# Patient Record
Sex: Male | Born: 1980 | Race: White | Hispanic: No | Marital: Married | Smoking: Current every day smoker
Health system: Southern US, Community
[De-identification: ages and names within clinical notes are randomized; demographics above are authoritative.]

## PROBLEM LIST (undated history)

## (undated) DIAGNOSIS — F32A Depression, unspecified: Secondary | ICD-10-CM

## (undated) DIAGNOSIS — F329 Major depressive disorder, single episode, unspecified: Secondary | ICD-10-CM

## (undated) DIAGNOSIS — K219 Gastro-esophageal reflux disease without esophagitis: Secondary | ICD-10-CM

## (undated) DIAGNOSIS — F419 Anxiety disorder, unspecified: Secondary | ICD-10-CM

## (undated) HISTORY — DX: Gastro-esophageal reflux disease without esophagitis: K21.9

## (undated) HISTORY — DX: Major depressive disorder, single episode, unspecified: F32.9

## (undated) HISTORY — DX: Depression, unspecified: F32.A

---

## 2010-01-08 ENCOUNTER — Ambulatory Visit: Payer: Self-pay | Admitting: Family Medicine

## 2010-01-08 DIAGNOSIS — F17200 Nicotine dependence, unspecified, uncomplicated: Secondary | ICD-10-CM | POA: Insufficient documentation

## 2010-08-15 ENCOUNTER — Emergency Department (HOSPITAL_BASED_OUTPATIENT_CLINIC_OR_DEPARTMENT_OTHER): Admission: EM | Admit: 2010-08-15 | Discharge: 2010-08-15 | Payer: Self-pay | Admitting: Emergency Medicine

## 2010-12-13 NOTE — Assessment & Plan Note (Signed)
Summary: NEW TO [facility] PER DR FRY//CCM   Vital Signs:  Patient profile:   30 year old male Height:      72 inches Weight:      223 pounds BMI:     30.35 Temp:     98.2 degrees F oral Pulse rate:   77 / minute BP sitting:   94 / 72  (left arm) Cuff size:   large  Vitals Entered By: Alfred Levins, CMA (January 08, 2010 8:38 AM) CC: establish, quit smoking   History of Present Illness: 30 yr old male to establish with [place]. He is with his wife who is also a pt. of ours. He would like assistance to quit smoking. he smokes 12-15 cigarettes per day. he has tried to quit twice before using Commit lozenges, but he was unsuccessful. he feels fine and has no other concerns. They just had a baby 3 weeks ago, and he is doing well.   Preventive Screening-Counseling & Management  Alcohol-Tobacco     Smoking Status: current     Packs/Day: 12 Cigs      Year Started: 1998     Pack years: 24  Caffeine-Diet-Exercise     Does Patient Exercise: no      Drug Use:  no.    Current Medications (verified): 1)  None  Allergies (verified): No Known Drug Allergies  Past History:  Past Medical History: Unremarkable  Past Surgical History: Denies surgical history  Family History: Reviewed history and no changes required. Family History of Arthritis Family History of CAD Male 1st degree relative <50 Family History Diabetes 1st degree relative Family History Hypertension  Social History: Reviewed history and no changes required. Married Current Smoker Alcohol use-no Drug use-no Regular exercise-no Smoking Status:  current Drug Use:  no Does Patient Exercise:  no Packs/Day:  12 Cigs   Review of Systems  The patient denies anorexia, fever, weight loss, weight gain, vision loss, decreased hearing, hoarseness, chest pain, syncope, dyspnea on exertion, peripheral edema, prolonged cough, headaches, hemoptysis, abdominal pain, melena, hematochezia, severe indigestion/heartburn,  hematuria, incontinence, genital sores, muscle weakness, suspicious skin lesions, transient blindness, difficulty walking, depression, unusual weight change, abnormal bleeding, enlarged lymph nodes, angioedema, breast masses, and testicular masses.    Physical Exam  General:  Well-developed,well-nourished,in no acute distress; alert,appropriate and cooperative throughout examination Neck:  No deformities, masses, or tenderness noted. Lungs:  Normal respiratory effort, chest expands symmetrically. Lungs are clear to auscultation, no crackles or wheezes. Heart:  Normal rate and regular rhythm. S1 and S2 normal without gallop, murmur, click, rub or other extra sounds.   Impression & Recommendations:  Problem # 1:  NICOTINE ADDICTION (ICD-305.1)  His updated medication list for this problem includes:    Chantix Starting Month Pak 0.5 Mg X 11 & 1 Mg X 42 Tabs (Varenicline tartrate) .Marland Kitchen... As directed    Chantix Continuing Month Pak 1 Mg Tabs (Varenicline tartrate) .Marland Kitchen... As directed  Complete Medication List: 1)  Chantix Starting Month Pak 0.5 Mg X 11 & 1 Mg X 42 Tabs (Varenicline tartrate) .... As directed 2)  Chantix Continuing Month Pak 1 Mg Tabs (Varenicline tartrate) .... As directed  Patient Instructions: 1)  Try Chantix for 3  months. We also discussed drinking a lot of water, getting exercise, and other helpful tips.  2)  Please schedule a follow-up appointment as needed .  Prescriptions: CHANTIX CONTINUING MONTH PAK 1 MG TABS (VARENICLINE TARTRATE) as directed  #1 x 1   Entered and  Authorized by:   Nelwyn Salisbury MD   Signed by:   Nelwyn Salisbury MD on 01/08/2010   Method used:   Electronically to        [facility] [address] [phone]* (retail)       [address] [place], [place]  [address]       Ph: [phone]       Fax: [phone]   RxID:   [id number] CHANTIX STARTING MONTH PAK 0.5 MG X 11 & 1 MG X 42 TABS (VARENICLINE TARTRATE) as directed  #1 x 0   Entered and Authorized  by:   Nelwyn Salisbury MD   Signed by:   Nelwyn Salisbury MD on 01/08/2010   Method used:   Electronically to        [facility] [address] [phone]* (retail)       [address] [place], [place]  [address]       Ph: [phone]       Fax: [phone]   RxID:   [id number]

## 2013-07-24 ENCOUNTER — Emergency Department (HOSPITAL_COMMUNITY): Payer: BC Managed Care – PPO

## 2013-07-24 ENCOUNTER — Emergency Department (HOSPITAL_COMMUNITY)
Admission: EM | Admit: 2013-07-24 | Discharge: 2013-07-24 | Disposition: A | Discharge status: Home / Self Care | Payer: BC Managed Care – PPO | Attending: Emergency Medicine | Admitting: Emergency Medicine

## 2013-07-24 ENCOUNTER — Encounter (HOSPITAL_COMMUNITY): Payer: Self-pay | Admitting: *Deleted

## 2013-07-24 DIAGNOSIS — R0789 Other chest pain: Secondary | ICD-10-CM | POA: Insufficient documentation

## 2013-07-24 DIAGNOSIS — F411 Generalized anxiety disorder: Secondary | ICD-10-CM | POA: Insufficient documentation

## 2013-07-24 DIAGNOSIS — F17200 Nicotine dependence, unspecified, uncomplicated: Secondary | ICD-10-CM | POA: Insufficient documentation

## 2013-07-24 DIAGNOSIS — R0602 Shortness of breath: Secondary | ICD-10-CM | POA: Insufficient documentation

## 2013-07-24 DIAGNOSIS — R42 Dizziness and giddiness: Secondary | ICD-10-CM | POA: Insufficient documentation

## 2013-07-24 DIAGNOSIS — F419 Anxiety disorder, unspecified: Secondary | ICD-10-CM

## 2013-07-24 DIAGNOSIS — R079 Chest pain, unspecified: Secondary | ICD-10-CM

## 2013-07-24 LAB — CBC
HCT: 43.2 % (ref 39.0–52.0)
Hemoglobin: 15.4 g/dL (ref 13.0–17.0)
MCH: 30.4 pg (ref 26.0–34.0)
MCV: 85.2 fL (ref 78.0–100.0)
RBC: 5.07 MIL/uL (ref 4.22–5.81)

## 2013-07-24 LAB — POCT I-STAT, CHEM 8
BUN: 12 mg/dL (ref 6–23)
Creatinine, Ser: 1.1 mg/dL (ref 0.50–1.35)
Glucose, Bld: 101 mg/dL — ABNORMAL HIGH (ref 70–99)
Sodium: 142 mEq/L (ref 135–145)
TCO2: 23 mmol/L (ref 0–100)

## 2013-07-24 LAB — POCT I-STAT TROPONIN I

## 2013-07-24 NOTE — ED Provider Notes (Signed)
I saw and evaluated the patient, reviewed the resident's note and I agree with the findings and plan.  32 year old male history of ephedrine use presenting with atypical chest pain. EKG shows some nonspecific ST changes most consistent with early repolarization. He is symptom-free and ED. On exam, normal heart sounds, regular rate and rhythm, no murmurs rubs gallops. Lungs clear patient bilaterally. Generally well-appearing, not diaphoretic. He is low risk for ACS, but given his ephedrine use will check cardiac enzymes. His story is inconsistent with dissection or PE.  Clinical Impression: 1. Chest pain   2. Anxiety       Candyce Churn, MD 07/25/13 1020

## 2013-07-24 NOTE — ED Notes (Addendum)
Per EMS, pt was at the [facility] when he started having right chest pain and SOB.  Prior to EMS arrival pt CP had resolved, but some slight SOB.  Pt given 324 ASA by EMS, and is currently only complaining of slight dizziness

## 2013-07-24 NOTE — ED Provider Notes (Signed)
CSN: [id number]     Arrival date & time 07/24/13  1949 History   First MD Initiated Contact with Patient 07/24/13 1952     Chief Complaint  Patient presents with  . Chest Pain  . Shortness of Breath  . Dizziness   (Consider location/radiation/quality/duration/timing/severity/associated sxs/prior Treatment) Patient is a 32 y.o. male presenting with chest pain. The history is provided by the patient. No language interpreter was used.  Chest Pain Pain location:  R chest Pain quality comment:  Cramping Pain radiates to:  Does not radiate Pain radiates to the back: no   Pain severity:  Mild Onset quality:  Sudden Duration:  1 minute Timing:  Intermittent Progression:  Partially resolved Chronicity:  New Context: at rest   Relieved by:  Nothing Worsened by:  Nothing tried Ineffective treatments:  None tried Associated symptoms: shortness of breath   Associated symptoms: no abdominal pain, no cough, no fever, no headache, no nausea, no orthopnea and not vomiting   Risk factors: smoking   Risk factors comment:  Ephedrine use   History reviewed. No pertinent past medical history. History reviewed. No pertinent past surgical history. No family history on file. History  Substance Use Topics  . Smoking status: Current Every Day Smoker  . Smokeless tobacco: Not on file  . Alcohol Use: Not on file    Review of Systems  Constitutional: Negative for fever.  HENT: Negative for congestion, sore throat and rhinorrhea.   Respiratory: Positive for shortness of breath. Negative for cough.   Cardiovascular: Positive for chest pain. Negative for orthopnea.  Gastrointestinal: Negative for nausea, vomiting, abdominal pain and diarrhea.  Genitourinary: Negative for dysuria and hematuria.  Skin: Negative for rash.  Neurological: Positive for light-headedness. Negative for syncope and headaches.  All other systems reviewed and are negative.    Allergies  Review of patient's allergies  indicates no known allergies.  Home Medications  No current outpatient prescriptions on file. BP 114/66  Pulse 63  Temp(Src) 98.3 F (36.8 C) (Oral)  Resp 14  Ht 6\' 1"  (1.854 m)  Wt 228 lb (103.42 kg)  BMI 30.09 kg/m2  SpO2 97% Physical Exam  Nursing note and vitals reviewed. Constitutional: He is oriented to person, place, and time. He appears well-developed and well-nourished.  HENT:  Head: Normocephalic and atraumatic.  Right Ear: External ear normal.  Left Ear: External ear normal.  Eyes: EOM are normal.  Neck: Normal range of motion. Neck supple.  Cardiovascular: Normal rate, regular rhythm and intact distal pulses.  Exam reveals no gallop and no friction rub.   No murmur heard. Pulmonary/Chest: Effort normal and breath sounds normal. No respiratory distress. He has no wheezes. He has no rales. He exhibits no tenderness.  Abdominal: Soft. Bowel sounds are normal. He exhibits no distension. There is no tenderness. There is no rebound.  Musculoskeletal: Normal range of motion. He exhibits no edema and no tenderness.  Lymphadenopathy:    He has no cervical adenopathy.  Neurological: He is alert and oriented to person, place, and time.  Skin: Skin is warm. No rash noted.  Psychiatric: He has a normal mood and affect. His behavior is normal.    ED Course  Procedures (including critical care time) Labs Review Labs Reviewed  POCT I-STAT, CHEM 8 - Abnormal; Notable for the following:    Glucose, Bld 101 (*)    All other components within normal limits  CBC  POCT I-STAT TROPONIN I  POCT I-STAT TROPONIN I   Imaging  Review Dg Chest Portable 1 View  07/24/2013   CLINICAL DATA:  Chest pain, shortness of breath.  EXAM: PORTABLE CHEST - 1 VIEW  COMPARISON:  None.  FINDINGS: The heart size and mediastinal contours are within normal limits. Both lungs are clear. The visualized skeletal structures are unremarkable.  IMPRESSION: No active disease.   Electronically Signed   By: Charlett Nose M.D.   On: 07/24/2013 20:36    MDM   1. Chest pain   2. Anxiety    8:07 PM Pt is a 32 y.o. male who presents to the ED with chest pain. Chest pain for since 6 PM. Described as crampy right side of chestwithout radiation. Associated shortness of breath,no diaphoresis, no nausea and no vomiting. Denies pleuritic chest pain. Denies fevers, recent illness, cough, congestion, URI. Denies personal history/family history of DVT/PE, denies recent immobilization, denies OCP use. Symptoms without modifying factors. Pt received ASA at EMS. PERC negative. Pt had associated lightheadedness. Pt on ephedrine as an energy supplement.  Risk Factors:tobacco abuse, ephedrine use  On exam: AFVSS, lungs CTA, no rubs murmurs or gallops. Plan for CBC, CMP, troponin, CXR and EKG.  EKG personally reviewed by myself showed NSR Rate of 93, PR , QRS QT/QTC 345/461ms, normal axis, without evidence of new ischemia. No Comparison, indication: chest pain. No clinical evidence of acute pericarditis  CXR PA/LAT per my read for chest pain showed no ptx, no pna, no cardiomegaly  Review of labs: CBC showed no leukocytosis, no anemia. istat chem 8 showed no electrolyte abnormality. First istat troponin negative. Will get a delta troponin given smoking and ephedrine use  On re-eval pt has no pain. Second troponin at 11PM showed no elevation. Plan for discharge with follow up with PCP as needed.  11:45 PM: I have discussed the diagnosis/risks/treatment options with the patient and family and believe the pt to be eligible for discharge home to follow-up with PCP as needed. We also discussed returning to the ED immediately if new or worsening sx occur. We discussed the sx which are most concerning (e.g., worsening chest pain) that necessitate immediate return. Any new prescriptions provided to the patient are listed below.   New Prescriptions   No medications on file    The patient appears reasonably screened  and/or stabilized for discharge and I doubt any other medical condition or other [facility] requiring further screening, evaluation or treatment in the ED at this time prior to discharge . Pt in agreement with discharge plan. Return precautions given. Pt discharged VSS   Labs, EKG and imaging reviewed by myself and considered in medical decision making if ordered.  Imaging interpreted by radiology. Pt was discussed with my attending, Dr. Lowella Petties, MD 07/24/13 [phone]

## 2013-07-25 NOTE — ED Provider Notes (Signed)
I saw and evaluated the patient, reviewed the resident's note and I agree with the findings and plan.   Candyce Churn, MD 07/25/13 1020

## 2013-07-26 ENCOUNTER — Telehealth: Payer: Self-pay | Admitting: Family Medicine
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
## 2013-07-26 NOTE — Telephone Encounter (Signed)
Per Dr. Clent Ridges okay to schedule and a 30 minute visit.

## 2013-07-26 NOTE — Telephone Encounter (Signed)
Scheduled for 8:30 Wednesday 07/28/16.

## 2013-07-28 ENCOUNTER — Ambulatory Visit (INDEPENDENT_AMBULATORY_CARE_PROVIDER_SITE_OTHER): Payer: BC Managed Care – PPO | Admitting: Family Medicine

## 2013-07-28 ENCOUNTER — Encounter: Payer: Self-pay | Admitting: Family Medicine

## 2013-07-28 VITALS — BP 118/80 | HR 75 | Temp 98.1°F | Ht 72.0 in | Wt 231.0 lb

## 2013-07-28 DIAGNOSIS — F411 Generalized anxiety disorder: Secondary | ICD-10-CM

## 2013-07-28 MED ORDER — ESCITALOPRAM OXALATE 10 MG PO TABS: 10.0000 mg | ORAL_TABLET | Freq: Every day | ORAL | Status: DC
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
## 2013-10-31 ENCOUNTER — Other Ambulatory Visit: Payer: Self-pay | Admitting: Family Medicine

## 2013-11-01 NOTE — Telephone Encounter (Signed)
Refill for one year 

## 2014-01-17 ENCOUNTER — Encounter: Payer: Self-pay | Admitting: Family Medicine

## 2014-01-17 ENCOUNTER — Ambulatory Visit (INDEPENDENT_AMBULATORY_CARE_PROVIDER_SITE_OTHER): Payer: BC Managed Care – PPO | Admitting: Family Medicine

## 2014-01-17 VITALS — BP 112/78 | HR 81 | Temp 98.2°F | Ht 72.0 in | Wt 225.0 lb

## 2014-01-17 DIAGNOSIS — F3289 Other specified depressive episodes: Secondary | ICD-10-CM

## 2014-01-17 DIAGNOSIS — F329 Major depressive disorder, single episode, unspecified: Secondary | ICD-10-CM

## 2014-01-17 DIAGNOSIS — M25569 Pain in unspecified knee: Secondary | ICD-10-CM

## 2014-01-17 DIAGNOSIS — F32A Depression, unspecified: Secondary | ICD-10-CM

## 2014-01-17 DIAGNOSIS — M25561 Pain in right knee: Secondary | ICD-10-CM

## 2014-01-17 MED ORDER — BUPROPION HCL ER (XL) 150 MG PO TB24: 150.0000 mg | ORAL_TABLET | Freq: Every day | ORAL | Status: DC

## 2014-01-17 NOTE — Progress Notes (Signed)
   Subjective:    Patient ID: Justin FlockDaniel Gravois, male    DOB: Jun 16, 1981, 33 y.o.   MRN: [id number]  HPI Here for 2 things. First he has injured his right knee twice in the past 2 weeks, and now he has significant pain in the knee when he walks or goes or down steps. No swelling, no locking or giving way. Both times he slipped on ice, the first 2 weeks ago at work, and the second one week ago at home. Using Ibuprofen. Also he asks to try another medication for depression. He stopped taking Lexapro some time ago because it made him "feel weird".    Review of Systems  Constitutional: Negative.   Musculoskeletal: Positive for arthralgias.  Psychiatric/Behavioral: Positive for dysphoric mood. The patient is nervous/anxious.        Objective:   Physical Exam  Constitutional:  In pain   Musculoskeletal:  The right knee shows no edema, ROM is full. Negative anterior drawer and McMurrays. Quite tender along both medial and lateral joint spaces   Psychiatric: He has a normal mood and affect. His behavior is normal. Thought content normal.          Assessment & Plan:  For the depression, try Wellbutrin XL 150 mg daily. Also refer to Orthopedics for probable torn cartilage in the knee.

## 2014-01-17 NOTE — Progress Notes (Signed)
Pre visit review using our clinic review tool, if applicable. No additional management support is needed unless otherwise documented below in the visit note. 

## 2014-01-18 ENCOUNTER — Telehealth: Payer: Self-pay | Admitting: Family Medicine

## 2014-01-18 NOTE — Telephone Encounter (Signed)
Relevant patient education assigned to patient using Emmi. ° °

## 2014-03-29 ENCOUNTER — Telehealth: Payer: Self-pay | Admitting: Family Medicine

## 2014-03-29 NOTE — Telephone Encounter (Signed)
[facility] - [place] - [address] is requesting re-fill on buPROPion (WELLBUTRIN XL) 150 MG 24 hr tablet

## 2014-03-30 MED ORDER — BUPROPION HCL ER (XL) 150 MG PO TB24: 150.0000 mg | ORAL_TABLET | Freq: Every day | ORAL | Status: DC

## 2014-03-30 NOTE — Telephone Encounter (Signed)
I sent script e-scribe. 

## 2014-03-30 NOTE — Telephone Encounter (Signed)
Refill for one year 

## 2014-04-28 ENCOUNTER — Emergency Department (HOSPITAL_COMMUNITY): Payer: BC Managed Care – PPO

## 2014-04-28 ENCOUNTER — Encounter (HOSPITAL_COMMUNITY): Payer: Self-pay | Admitting: Emergency Medicine

## 2014-04-28 ENCOUNTER — Emergency Department (HOSPITAL_COMMUNITY)
Admission: EM | Admit: 2014-04-28 | Discharge: 2014-04-28 | Disposition: A | Discharge status: Home / Self Care | Payer: BC Managed Care – PPO | Attending: Emergency Medicine | Admitting: Emergency Medicine

## 2014-04-28 DIAGNOSIS — F411 Generalized anxiety disorder: Secondary | ICD-10-CM | POA: Insufficient documentation

## 2014-04-28 DIAGNOSIS — Z79899 Other long term (current) drug therapy: Secondary | ICD-10-CM | POA: Insufficient documentation

## 2014-04-28 DIAGNOSIS — R079 Chest pain, unspecified: Secondary | ICD-10-CM | POA: Insufficient documentation

## 2014-04-28 DIAGNOSIS — F17200 Nicotine dependence, unspecified, uncomplicated: Secondary | ICD-10-CM | POA: Insufficient documentation

## 2014-04-28 HISTORY — DX: Anxiety disorder, unspecified: F41.9

## 2014-04-28 LAB — CBC
HCT: 45.5 % (ref 39.0–52.0)
Hemoglobin: 15.8 g/dL (ref 13.0–17.0)
MCH: 29.9 pg (ref 26.0–34.0)
MCHC: 34.7 g/dL (ref 30.0–36.0)
MCV: 86.2 fL (ref 78.0–100.0)
PLATELETS: 193 10*3/uL (ref 150–400)
RBC: 5.28 MIL/uL (ref 4.22–5.81)
RDW: 12 % (ref 11.5–15.5)
WBC: 8.2 10*3/uL (ref 4.0–10.5)

## 2014-04-28 LAB — BASIC METABOLIC PANEL
BUN: 13 mg/dL (ref 6–23)
CALCIUM: 10 mg/dL (ref 8.4–10.5)
CO2: 26 mEq/L (ref 19–32)
Chloride: 102 mEq/L (ref 96–112)
Creatinine, Ser: 0.85 mg/dL (ref 0.50–1.35)
GFR calc non Af Amer: >90 mL/min (ref >90)
Glucose, Bld: 84 mg/dL (ref 70–99)
Potassium: 4 mEq/L (ref 3.7–5.3)
Sodium: 141 mEq/L (ref 137–147)

## 2014-04-28 LAB — I-STAT TROPONIN, ED
TROPONIN I, POC: 0 ng/mL (ref 0.00–0.08)
Troponin i, poc: 0.01 ng/mL (ref 0.00–0.08)

## 2014-04-28 MED ORDER — LORAZEPAM 1 MG PO TABS
0.5000 mg | ORAL_TABLET | Freq: Once | ORAL | Status: AC
  Administered 2014-04-28: 0.5 mg via ORAL
  Filled 2014-04-28: qty 1

## 2014-04-28 MED ORDER — LORAZEPAM 0.5 MG PO TABS: 0.5000 mg | ORAL_TABLET | ORAL | Status: DC | PRN

## 2014-04-28 MED ORDER — OMEPRAZOLE 20 MG PO CPDR: 20.0000 mg | DELAYED_RELEASE_CAPSULE | Freq: Every day | ORAL | Status: DC

## 2014-04-28 NOTE — ED Notes (Signed)
Pt verbalizes understanding of d/c instructions and denies any further needs at this time. 

## 2014-04-28 NOTE — Discharge Instructions (Signed)
Aspirin and Your Heart Aspirin affects the way your blood clots and helps "thin" the blood. Aspirin has many uses in heart disease. It may be used as a primary prevention to help reduce the risk of heart related events. It also can be used as a secondary measure to prevent more heart attacks or to prevent additional damage from blood clots.  ASPIRIN MAY HELP IF YOU:  Have had a heart attack or chest pain.  Have undergone open heart surgery such as CABG (Coronary Artery Bypass Surgery).  Have had coronary angioplasty with or without stents.  Have experienced a stroke or TIA (transient ischemic attack).  Have peripheral vascular disease (PAD).  Have chronic heart rhythm problems such as atrial fibrillation.  Are at risk for heart disease. BEFORE STARTING ASPIRIN Before you start taking aspirin, your caregiver will need to review your medical history. Many things will need to be taken into consideration, such as:  Smoking status.  Blood pressure.  Diabetes.  Gender.  Weight.  Cholesterol level. ASPIRIN DOSES  Aspirin should only be taken on the advice of your caregiver. Talk to your caregiver about how much aspirin you should take. Aspirin comes in different doses such as:  81 mg.  162 mg.  325 mg.  The aspirin dose you take may be affected by many factors, some of which include:  Your current medications, especially if your are taking blood-thinners or anti-platelet medicine.  Liver function.  Heart disease risk.  Age.  Aspirin comes in two forms:  Non-enteric-coated. This type of aspirin does not have a coating and is absorbed faster. Non-enteric coated aspirin is recommended for patients experiencing chest pain symptoms. This type of aspirin also comes in a chewable form.  Enteric-coated. This means the aspirin has a special coating that releases the medicine very slowly. Enteric-coated aspirin causes less stomach upset. This type of aspirin should not be chewed  or crushed. ASPIRIN SIDE EFFECTS Daily use of aspirin can increase your risk of serious side effects. Some of these include:  Increased bleeding. This can range from a cut that does not stop bleeding to more serious problems such as stomach bleeding or bleeding into the brain (Intracerebral bleeding).  Increased bruising.  Stomach upset.  An allergic reaction such as red, itchy skin.  Increased risk of bleeding when combined with non-steroidal anti-inflammatory medicine (NSAIDS).  Alcohol should be drank in moderation when taking aspirin. Alcohol can increase the risk of stomach bleeding when taken with aspirin.  Aspirin should not be given to children less than 68 years of age due to the association of Reye syndrome. Reye syndrome is a serious illness that can affect the brain and liver. Studies have linked Reye syndrome with aspirin use in children.  People that have nasal polyps have an increased risk of developing an aspirin allergy. SEEK MEDICAL CARE IF:   You develop an allergic reaction such as:  Hives.  Itchy skin.  Swelling of the lips, tongue or face.  You develop stomach pain.  You have unusual bleeding or bruising.  You have ringing in your ears. SEEK IMMEDIATE MEDICAL CARE IF:   You have severe chest pain, especially if the pain is crushing or pressure-like and spreads to the arms, back, neck, or jaw. THIS IS AN EMERGENCY. Do not wait to see if the pain will go away. Get medical help at once. Call your local emergency services (911 in the [place] DO NOT drive yourself to the hospital.  You have stroke-like symptoms  such as:  Loss of vision.  Difficulty talking.  Numbness or weakness on one side of your body.  Numbness or weakness in your arm or leg.  Not thinking clearly or feeling confused.  Your bowel movements are bloody, dark red or black in color.  You vomit or cough up blood.  You have blood in your urine.  You have shortness of breath,  coughing or wheezing. MAKE SURE YOU:   Understand these instructions.  Will monitor your condition.  Seek immediate medical care if necessary. Document Released: 10/10/2008 Document Revised: 02/22/2013 Document Reviewed: 10/10/2008 [facility] Patient Information 2015 [place], [place]. This information is not intended to replace advice given to you by your health care provider. Make sure you discuss any questions you have with your health care provider.  Chest Pain (Nonspecific) It is often hard to give a specific diagnosis for the cause of chest pain. There is always a chance that your pain could be related to something serious, such as a heart attack or a blood clot in the lungs. You need to follow up with your health care provider for further evaluation. CAUSES   Heartburn.  Pneumonia or bronchitis.  Anxiety or stress.  Inflammation around your heart (pericarditis) or lung (pleuritis or pleurisy).  A blood clot in the lung.  A collapsed lung (pneumothorax). It can develop suddenly on its own (spontaneous pneumothorax) or from trauma to the chest.  Shingles infection (herpes zoster virus). The chest wall is composed of bones, muscles, and cartilage. Any of these can be the source of the pain.  The bones can be bruised by injury.  The muscles or cartilage can be strained by coughing or overwork.  The cartilage can be affected by inflammation and become sore (costochondritis). DIAGNOSIS  Lab tests or other studies may be needed to find the cause of your pain. Your health care provider may have you take a test called an ambulatory electrocardiogram (ECG). An ECG records your heartbeat patterns over a 24-hour period. You may also have other tests, such as:  Transthoracic echocardiogram (TTE). During echocardiography, sound waves are used to evaluate how blood flows through your heart.  Transesophageal echocardiogram (TEE).  Cardiac monitoring. This allows your health care provider  to monitor your heart rate and rhythm in real time.  Holter monitor. This is a portable device that records your heartbeat and can help diagnose heart arrhythmias. It allows your health care provider to track your heart activity for several days, if needed.  Stress tests by exercise or by giving medicine that makes the heart beat faster. TREATMENT   Treatment depends on what may be causing your chest pain. Treatment may include:  Acid blockers for heartburn.  Anti-inflammatory medicine.  Pain medicine for inflammatory conditions.  Antibiotics if an infection is present.  You may be advised to change lifestyle habits. This includes stopping smoking and avoiding alcohol, caffeine, and chocolate.  You may be advised to keep your head raised (elevated) when sleeping. This reduces the chance of acid going backward from your stomach into your esophagus. Most of the time, nonspecific chest pain will improve within 2-3 days with rest and mild pain medicine.  HOME CARE INSTRUCTIONS   If antibiotics were prescribed, take them as directed. Finish them even if you start to feel better.  For the next few days, avoid physical activities that bring on chest pain. Continue physical activities as directed.  Do not use any tobacco products, including cigarettes, chewing tobacco, or electronic cigarettes.  Avoid drinking alcohol.  Only take medicine as directed by your health care provider.  Follow your health care provider's suggestions for further testing if your chest pain does not go away.  Keep any follow-up appointments you made. If you do not go to an appointment, you could develop lasting (chronic) problems with pain. If there is any problem keeping an appointment, call to reschedule. SEEK MEDICAL CARE IF:   Your chest pain does not go away, even after treatment.  You have a rash with blisters on your chest.  You have a fever. SEEK IMMEDIATE MEDICAL CARE IF:   You have increased  chest pain or pain that spreads to your arm, neck, jaw, back, or abdomen.  You have shortness of breath.  You have an increasing cough, or you cough up blood.  You have severe back or abdominal pain.  You feel nauseous or vomit.  You have severe weakness.  You faint.  You have chills. This is an emergency. Do not wait to see if the pain will go away. Get medical help at once. Call your local emergency services (911 in [place] Do not drive yourself to the hospital. MAKE SURE YOU:   Understand these instructions.  Will watch your condition.  Will get help right away if you are not doing well or get worse. Document Released: 08/07/2005 Document Revised: 11/02/2013 Document Reviewed: 06/02/2008 [facility] Patient Information 2015 [place], [place]. This information is not intended to replace advice given to you by your health care provider. Make sure you discuss any questions you have with your health care provider.

## 2014-04-28 NOTE — ED Provider Notes (Signed)
CSN: [id number]     Arrival date & time 04/28/14  1404 History   First MD Initiated Contact with Patient 04/28/14 1750     Chief Complaint  Patient presents with  . Chest Pain      HPI Per EMS, patient states he was driving around [id number] today and started having chest tightness. Patient states he is still having the tightness in his chest with dizziness and SOB. Patient was seen two days ago at a different hospital for the same and diagnosed with anxiety per EMS.  Past Medical History  Diagnosis Date  . Anxiety    History reviewed. No pertinent past surgical history. Family History  Problem Relation Age of Onset  . Diabetes Father   . Heart disease Father   . Hypertension Father    History  Substance Use Topics  . Smoking status: Current Every Day Smoker -- 0.50 packs/day    Types: Cigarettes  . Smokeless tobacco: Never Used     Comment: or less  . Alcohol Use: Yes     Comment: occ    Review of Systems  All other systems reviewed and are negative  Allergies  Bee venom  Home Medications   Prior to Admission medications   Medication Sig Start Date End Date Taking? Authorizing Provider  buPROPion (WELLBUTRIN XL) 150 MG 24 hr tablet Take 1 tablet (150 mg total) by mouth daily. 03/30/14  Yes Nelwyn SalisburyStephen A Fry, MD  hydrOXYzine (ATARAX/VISTARIL) 25 MG tablet Take 25 mg by mouth every 6 (six) hours as needed for anxiety.   Yes Historical Provider, MD  ibuprofen (ADVIL,MOTRIN) 200 MG tablet Take 400 mg by mouth every 6 (six) hours as needed.   Yes Historical Provider, MD  LORazepam (ATIVAN) 0.5 MG tablet Take 1 tablet (0.5 mg total) by mouth every 4 (four) hours as needed for anxiety. 04/28/14   Nelia Shiobert L Dutchess Crosland, MD  omeprazole (PRILOSEC) 20 MG capsule Take 1 capsule (20 mg total) by mouth daily. 04/28/14   Nelia Shiobert L Kylei Purington, MD   BP 111/70  Pulse 63  Temp(Src) 98.4 F (36.9 C) (Oral)  Resp 17  Ht 6\' 1"  (1.854 m)  Wt 235 lb (106.595 kg)  BMI 31.01 kg/m2  SpO2 99% Physical Exam   Nursing note and vitals reviewed. Constitutional: He is oriented to person, place, and time. He appears well-developed and well-nourished. No distress.  HENT:  Head: Normocephalic and atraumatic.  Eyes: Pupils are equal, round, and reactive to light.  Neck: Normal range of motion.  Cardiovascular: Normal rate and intact distal pulses.   Pulmonary/Chest: No respiratory distress.  Abdominal: Normal appearance. He exhibits no distension. There is no tenderness. There is no rebound and no guarding.  Musculoskeletal: Normal range of motion.  Neurological: He is alert and oriented to person, place, and time. No cranial nerve deficit.  Skin: Skin is warm and dry. No rash noted.  Psychiatric: He has a normal mood and affect. His behavior is normal.    ED Course  Procedures (including critical care time) Labs Review Labs Reviewed  CBC  BASIC METABOLIC PANEL  I-STAT TROPOININ, ED  I-STAT TROPOININ, ED   Heart score = 1 Imaging Review Dg Chest 2 View  04/28/2014   CLINICAL DATA:  Chest pain and dizziness.  EXAM: CHEST  2 VIEW  COMPARISON:  04/26/2014  FINDINGS: The heart size and mediastinal contours are within normal limits. Both lungs are clear. The visualized skeletal structures are unremarkable.  IMPRESSION: No acute cardiopulmonary findings.  Electronically Signed   By: Loralie ChampagneMark  Gallerani M.D.   On: 04/28/2014 15:43     EKG Interpretation   Date/Time:  Thursday April 28 2014 14:06:42 EDT Ventricular Rate:  69 PR Interval:  156 QRS Duration: 96 QT Interval:  382 QTC Calculation: 409 R Axis:   62 Text Interpretation:  Normal sinus rhythm Normal ECG Confirmed by Kaytlynn Kochan   MD, Cory Kitt ([id number]) on 04/28/2014 5:53:00 PM     Discussed case with cardiologist Dr. Myrtis SerKatz, who agreed to see the patient as outpatient.  Discussed the low risk of ACS but not completely ruled out.  Patient stable and without symptoms at time of discharge.After treatment in the ED the patient feels back to baseline  and wants to go home. MDM   Final diagnoses:  Chest pain, unspecified chest pain type        Nelia Shiobert L Espn Zeman, MD 04/29/14 2302

## 2014-04-28 NOTE — ED Notes (Signed)
Pt was seen two days ago at a different hospital for similar symptoms, was sent home with prescription for hydroxyzine for anxiety and was fine yesterday, took it this morning and was ok, started having chest tightness when driving to work today.  States that the Ativan given in triage has not helped, has only made him more dizzy than he already is.

## 2014-04-28 NOTE — ED Notes (Signed)
Per EMS, patient states he was driving around [id number] today and started having chest tightness.   Patient states he is still having the tightness in his chest with dizziness and SOB.   Patient was seen two days ago at a different hospital for the same and diagnosed with anxiety per EMS.  18G placed in L hand by EMS PTA.

## 2014-04-29 ENCOUNTER — Telehealth: Payer: Self-pay | Admitting: Family Medicine

## 2014-04-29 NOTE — Telephone Encounter (Signed)
I read the ED note and this does NOT sound cardiac to me. Have him stay on Prilosec OTC every day and see me after I get back in the office

## 2014-04-29 NOTE — Telephone Encounter (Signed)
Pt seen in ed yesterday. Advised to fu w/ cardiologist.  Pt would like to see dr fry in additin to this for another opinion. hosp states poss acid reflux. No 30 min appt . Dr fry out next week. pls advise

## 2014-05-02 NOTE — Telephone Encounter (Signed)
I left a voice message with below information. 

## 2014-05-11 ENCOUNTER — Telehealth: Payer: Self-pay | Admitting: Family Medicine

## 2014-05-11 NOTE — Telephone Encounter (Signed)
Pt has been sch to 05-16-14 ok per sylvia

## 2014-05-11 NOTE — Telephone Encounter (Signed)
Pt was on Dr. Elmyra RicksKim's schedule for 05/12/14, she advised that I call pt to reschedule pt to see Dr.Fry. I spoke with pt and will forward this note to Dr. Clent RidgesFry and we will call pt back to schedule. I was told we do not have any more openings for 30 minute visits. Please advise?

## 2014-05-11 NOTE — Telephone Encounter (Signed)
Pt was prescribed lorazepam 0.5mg  by dr Radford Paxbeaton at [facility] . Pt has appt on 05-16-14 and needs more lorazepam call into [facility]

## 2014-05-12 ENCOUNTER — Ambulatory Visit: Payer: BC Managed Care – PPO | Admitting: Family Medicine

## 2014-05-12 MED ORDER — LORAZEPAM 1 MG PO TABS: 1.0000 mg | ORAL_TABLET | Freq: Four times a day (QID) | ORAL | Status: DC | PRN

## 2014-05-12 NOTE — Telephone Encounter (Signed)
Increase the Lorazepam to 1 mg and he can take this q 6 hours prn anxiety, call in #120 with no rf

## 2014-05-12 NOTE — Telephone Encounter (Signed)
I called in script and left a message for pt. 

## 2014-05-16 ENCOUNTER — Ambulatory Visit: Payer: BC Managed Care – PPO | Admitting: Family Medicine

## 2014-05-18 ENCOUNTER — Telehealth: Payer: Self-pay | Admitting: Family Medicine

## 2014-05-18 ENCOUNTER — Ambulatory Visit: Payer: BC Managed Care – PPO | Admitting: Family Medicine

## 2014-05-18 NOTE — Telephone Encounter (Signed)
Yes, can schedule for Friday 05/20/14 or next week, needs a 30 minute office visit.

## 2014-05-18 NOTE — Telephone Encounter (Signed)
appt scheduled for pt.  

## 2014-05-18 NOTE — Telephone Encounter (Signed)
Pt's wife is calling wanting to know if dr. Clent [name] can work pt in anytime this week or next week in the morning, pt has appt today at 4 however he is working out of town and can not come in.

## 2014-05-25 ENCOUNTER — Ambulatory Visit (INDEPENDENT_AMBULATORY_CARE_PROVIDER_SITE_OTHER): Payer: 59 | Admitting: Family Medicine

## 2014-05-25 ENCOUNTER — Encounter: Payer: Self-pay | Admitting: Family Medicine

## 2014-05-25 VITALS — BP 112/74 | HR 92 | Temp 99.0°F | Ht 73.0 in | Wt 220.0 lb

## 2014-05-25 DIAGNOSIS — K219 Gastro-esophageal reflux disease without esophagitis: Secondary | ICD-10-CM

## 2014-05-25 DIAGNOSIS — R079 Chest pain, unspecified: Secondary | ICD-10-CM

## 2014-05-25 DIAGNOSIS — F411 Generalized anxiety disorder: Secondary | ICD-10-CM

## 2014-05-25 MED ORDER — SERTRALINE HCL 50 MG PO TABS: 50.0000 mg | ORAL_TABLET | Freq: Every day | ORAL | Status: DC

## 2014-05-25 MED ORDER — OMEPRAZOLE 40 MG PO CPDR: 40.0000 mg | DELAYED_RELEASE_CAPSULE | Freq: Every day | ORAL | Status: DC

## 2014-05-25 NOTE — Progress Notes (Signed)
   Subjective:    Patient ID: Justin FlockDaniel [name], male    DOB: 09-06-1981, 33 y.o.   MRN: [id number]  HPI Here to follow up two ER visits recently for chest pain, [facility] and the other to [facility] a few days before that. His workups were negative including CXR, EKG, and cardiac enzymes. Each time he was told this was from anxiety with possible GERD. He was started on 20 mg a day of Ompeprazole and Ativan 1 mg. He has continued on his usual Wellbutrin each morning. He as had no further chest pains and he is taking Ativan 3-4 times a day, however this is sedating to him. He sleeps well.    Review of Systems  Constitutional: Negative.   Respiratory: Negative.   Cardiovascular: Positive for chest pain. Negative for palpitations and leg swelling.  Gastrointestinal: Negative.   Psychiatric/Behavioral: Negative for behavioral problems, confusion, dysphoric mood, decreased concentration and agitation. The patient is nervous/anxious.        Objective:   Physical Exam  Constitutional: He appears well-developed and well-nourished.  Cardiovascular: Normal rate, regular rhythm, normal heart sounds and intact distal pulses.   Pulmonary/Chest: Effort normal and breath sounds normal.  Psychiatric: He has a normal mood and affect. His behavior is normal. Thought content normal.          Assessment & Plan:  This does sound like the effects of anxiety in addition to GERD. We will increase the daily Omeprazole to 40 mg. Instead of Ativan he will start on Zoloft 50 mg daily along with the Wellbutrin. Stop the aspirin. Recheck in 3-4 weeks

## 2014-05-25 NOTE — Progress Notes (Signed)
Pre visit review using our clinic review tool, if applicable. No additional management support is needed unless otherwise documented below in the visit note. 

## 2014-06-20 ENCOUNTER — Telehealth: Payer: Self-pay | Admitting: Family Medicine

## 2014-06-20 NOTE — Telephone Encounter (Signed)
Pt request refill of the following:  LORazepam (ATIVAN) 1 MG tablet    Phamacy:   [facility]

## 2014-06-20 NOTE — Telephone Encounter (Signed)
Call in #[phone] with 2 rf 

## 2014-06-21 MED ORDER — LORAZEPAM 1 MG PO TABS: 1.0000 mg | ORAL_TABLET | Freq: Four times a day (QID) | ORAL | Status: DC | PRN

## 2014-06-21 NOTE — Telephone Encounter (Signed)
I called in script 

## 2014-07-21 ENCOUNTER — Telehealth: Payer: Self-pay | Admitting: Family Medicine

## 2014-07-21 ENCOUNTER — Encounter: Payer: Self-pay | Admitting: Family Medicine

## 2014-07-21 ENCOUNTER — Ambulatory Visit (INDEPENDENT_AMBULATORY_CARE_PROVIDER_SITE_OTHER): Payer: 59 | Admitting: Family Medicine

## 2014-07-21 VITALS — BP 116/80 | HR 74 | Temp 97.7°F | Ht 73.0 in | Wt 223.0 lb

## 2014-07-21 DIAGNOSIS — F3289 Other specified depressive episodes: Secondary | ICD-10-CM

## 2014-07-21 DIAGNOSIS — K219 Gastro-esophageal reflux disease without esophagitis: Secondary | ICD-10-CM

## 2014-07-21 DIAGNOSIS — F32A Depression, unspecified: Secondary | ICD-10-CM

## 2014-07-21 DIAGNOSIS — F411 Generalized anxiety disorder: Secondary | ICD-10-CM

## 2014-07-21 DIAGNOSIS — F329 Major depressive disorder, single episode, unspecified: Secondary | ICD-10-CM

## 2014-07-21 MED ORDER — SERTRALINE HCL 25 MG PO TABS: 25.0000 mg | ORAL_TABLET | Freq: Every day | ORAL | Status: DC

## 2014-07-21 NOTE — Telephone Encounter (Signed)
FYI

## 2014-07-21 NOTE — Progress Notes (Signed)
Pre visit review using our clinic review tool, if applicable. No additional management support is needed unless otherwise documented below in the visit note. 

## 2014-07-21 NOTE — Patient Instructions (Signed)
-  taper off of the zoloft with lower dose  -continue ativan   -get counseling - call to set up today  -continue prilosec and try the dietary changes  Food Choices for Gastroesophageal Reflux Disease When you have gastroesophageal reflux disease (GERD), the foods you eat and your eating habits are very important. Choosing the right foods can help ease the discomfort of GERD. WHAT GENERAL GUIDELINES DO I NEED TO FOLLOW?  Choose fruits, vegetables, whole grains, low-fat dairy products, and low-fat meat, fish, and poultry.  Limit fats such as oils, salad dressings, butter, nuts, and avocado.  Keep a food diary to identify foods that cause symptoms.  Avoid foods that cause reflux. These may be different for different people.  Eat frequent small meals instead of three large meals each day.  Eat your meals slowly, in a relaxed setting.  Limit fried foods.  Cook foods using methods other than frying.  Avoid drinking alcohol.  Avoid drinking large amounts of liquids with your meals.  Avoid bending over or lying down until 2-3 hours after eating. WHAT FOODS ARE NOT RECOMMENDED? The following are some foods and drinks that may worsen your symptoms: Vegetables Tomatoes. Tomato juice. Tomato and spaghetti sauce. Chili peppers. Onion and garlic. Horseradish. Fruits Oranges, grapefruit, and lemon (fruit and juice). Meats High-fat meats, fish, and poultry. This includes hot dogs, ribs, ham, sausage, salami, and bacon. Dairy Whole milk and chocolate milk. Sour cream. Cream. Butter. Ice cream. Cream cheese.  Beverages Coffee and tea, with or without caffeine. Carbonated beverages or energy drinks. Condiments Hot sauce. Barbecue sauce.  Sweets/Desserts Chocolate and cocoa. Donuts. Peppermint and spearmint. Fats and Oils High-fat foods, including [place] fries and potato chips. Other Vinegar. Strong spices, such as black pepper, white pepper, red pepper, cayenne, curry powder, cloves,  ginger, and chili powder. The items listed above may not be a complete list of foods and beverages to avoid. Contact your dietitian for more information. Document Released: 10/28/2005 Document Revised: 11/02/2013 Document Reviewed: 09/01/2013 [facility] Patient Information 2015 [place], [place]. This information is not intended to replace advice given to you by your health care provider. Make sure you discuss any questions you have with your health care provider.
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
## 2014-07-21 NOTE — Telephone Encounter (Signed)
Patient Information: ° Caller Name: Justin [name] ° Phone: [phone] ° Patient: Justin [name], Justin [name] ° Gender: Male ° DOB: 05/05/1981 ° Age: 32 Years ° PCP: Fry, Stephen (Family Practice) ° °Office Follow Up: ° Does the office need to follow up with this patient?: No ° Instructions For The Office: N/A ° °RN Note: ° Per disposition contacted the office and spoke with Suandrea  and approved appt with Dr Kim at 1300, 07/21/14. ° °Symptoms ° Reason For Call & Symptoms: Pt's wife/Justin [name] reports pt has panic attacks with acid reflux. Pt states he is have more frequent  episodes and the medication is not working. ° Reviewed Health History In EMR: Yes ° Reviewed Medications In EMR: Yes ° Reviewed Allergies In EMR: Yes ° Reviewed Surgeries / Procedures: Yes ° Date of Onset of Symptoms: 07/07/2014 ° °Guideline(s) Used: ° Chest Pain ° °Disposition Per Guideline:  ° Go to ED Now (or to Office with PCP Approval) ° °Reason For Disposition Reached:  ° Dizziness or lightheadedness ° °Advice Given: ° Call Back If: ° You become worse. ° °Patient Will Follow Care Advice: ° YES ° °Appointment Scheduled: ° 07/21/2014 13:00:00 °Appointment Scheduled Provider: ° Kim (TEXT 1st, after 20 mins can call), Hannah (Family Practice) ° ° °

## 2014-07-21 NOTE — Telephone Encounter (Signed)
Patient Information:  Caller Name: Justin [name]  Phone: [phone]  Patient: Justin, [name]  Gender: Male  DOB: 13-Sep-1981  Age: 33 Years  PCP: Gershon Crane [facility])  Office Follow Up:  Does the office need to follow up with this patient?: No  Instructions For The Office: N/A  RN Note:  Per disposition contacted the office and spoke with Tim Lair  and approved appt with Dr Selena Batten at 1300, 07/21/14.  Symptoms  Reason For Call & Symptoms: Pt's wife/Justin [name] reports pt has panic attacks with acid reflux. Pt states he is have more frequent  episodes and the medication is not working.  Reviewed Health History In EMR: Yes  Reviewed Medications In EMR: Yes  Reviewed Allergies In EMR: Yes  Reviewed Surgeries / Procedures: Yes  Date of Onset of Symptoms: 07/07/2014  Guideline(s) Used:  Chest Pain  Disposition Per Guideline:   Go to ED Now (or to Office with PCP Approval)  Reason For Disposition Reached:   Dizziness or lightheadedness  Advice Given:  Call Back If:  You become worse.  Patient Will Follow Care Advice:  YES  Appointment Scheduled:  07/21/2014 13:00:00 Appointment Scheduled Provider:  Selena Batten (TEXT 1st, after 20 mins can call), Dahlia Client [facility])

## 2014-08-04 ENCOUNTER — Ambulatory Visit: Payer: 59 | Admitting: Family Medicine

## 2014-08-15 NOTE — Telephone Encounter (Signed)
° °  Patient Information:  Caller Name: Morrie Sheldonshley  Phone: [phone]  Patient: Justin [name], Justin [name]  Gender: Male  DOB: 1981/06/17  Age: 33 Years  PCP: Gershon CraneFry, [facility](Family Practice)  Office Follow Up:  Does the office need to follow up with this patient?: No  Instructions For The Office: N/A   Symptoms  Reason For Call & Symptoms: Pts spouse called to see if pt could take Delsym or Mucinex DM as an otc medication to treat cold sx with cough that have arisen. Not with the pt to do a triage/just wanting to make sure he doesn't have a problem with medications. RN checked chart/per EPIC and advised spouse that the pt is ok to go ahead and take an otc cough/cold remedy. She will call back if triage is needed.  Reviewed Health History In EMR: N/A  Reviewed Medications In EMR: N/A  Reviewed Allergies In EMR: N/A  Reviewed Surgeries / Procedures: N/A  Date of Onset of Symptoms: 08/15/2014  Guideline(s) Used:  No Protocol Available - Information Only  Disposition Per Guideline:   Home Care  Reason For Disposition Reached:   Information only question and nurse able to answer  Advice Given:  N/A  Patient Will Follow Care Advice:  YES

## 2014-08-15 NOTE — Telephone Encounter (Signed)
Noted  

## 2014-08-31 ENCOUNTER — Telehealth: Payer: Self-pay | Admitting: Family Medicine

## 2014-08-31 NOTE — Telephone Encounter (Signed)
[facility] - [place] - [address] is requesting re-fill on hydrOXYzine (ATARAX/VISTARIL) 25 MG tablet

## 2014-09-01 ENCOUNTER — Ambulatory Visit (INDEPENDENT_AMBULATORY_CARE_PROVIDER_SITE_OTHER): Payer: 59 | Admitting: Family Medicine

## 2014-09-01 ENCOUNTER — Encounter: Payer: Self-pay | Admitting: Family Medicine

## 2014-09-01 VITALS — BP 123/88 | HR 90 | Temp 98.1°F | Ht 73.0 in | Wt 225.0 lb

## 2014-09-01 DIAGNOSIS — F329 Major depressive disorder, single episode, unspecified: Secondary | ICD-10-CM

## 2014-09-01 DIAGNOSIS — F32A Depression, unspecified: Secondary | ICD-10-CM

## 2014-09-01 DIAGNOSIS — K219 Gastro-esophageal reflux disease without esophagitis: Secondary | ICD-10-CM

## 2014-09-01 DIAGNOSIS — F411 Generalized anxiety disorder: Secondary | ICD-10-CM

## 2014-09-01 DIAGNOSIS — J209 Acute bronchitis, unspecified: Secondary | ICD-10-CM

## 2014-09-01 MED ORDER — HYDROCODONE-HOMATROPINE 5-1.5 MG/5ML PO SYRP: 5.0000 mL | ORAL_SOLUTION | ORAL | Status: DC | PRN

## 2014-09-01 MED ORDER — OMEPRAZOLE 40 MG PO CPDR: 40.0000 mg | DELAYED_RELEASE_CAPSULE | Freq: Every day | ORAL | Status: DC

## 2014-09-01 MED ORDER — LAMOTRIGINE 25 MG PO TABS: 50.0000 mg | ORAL_TABLET | Freq: Every day | ORAL | Status: DC

## 2014-09-01 MED ORDER — AZITHROMYCIN 250 MG PO TABS: ORAL_TABLET | ORAL | Status: DC

## 2014-09-01 MED ORDER — SILDENAFIL CITRATE 100 MG PO TABS: 100.0000 mg | ORAL_TABLET | ORAL | Status: DC | PRN

## 2014-09-01 NOTE — Progress Notes (Signed)
   Subjective:    Patient ID: Justin [name], male    DOB: May 11, 1981, 33 y.o.   MRN: [id number]  HPI Here for several things. First of all his depression is well controlled with Wellbutrin XL but he still has problems with his anxiety. He had to stop Zoloft due to side effects. He still takes Ativan 4 times daily and this helps a lot, however it doesn't last very long. He sleeps well at night, but when he wakes up each morning the anxiety has already started. His GERD is stable. Also for the past 2 weeks he has had chest congestion and has been coughing up green sputum.    Review of Systems  Constitutional: Negative.   HENT: Positive for congestion and postnasal drip. Negative for sinus pressure.   Eyes: Negative.   Respiratory: Positive for cough.   Gastrointestinal: Negative.   Psychiatric/Behavioral: Positive for decreased concentration. Negative for behavioral problems, confusion and agitation. The patient is nervous/anxious.        Objective:   Physical Exam  Constitutional: He is oriented to person, place, and time. He appears well-developed and well-nourished.  HENT:  Right Ear: External ear normal.  Left Ear: External ear normal.  Nose: Nose normal.  Mouth/Throat: Oropharynx is clear and moist.  Eyes: Conjunctivae are normal.  Pulmonary/Chest: Effort normal and breath sounds normal.  Lymphadenopathy:    He has no cervical adenopathy.  Neurological: He is alert and oriented to person, place, and time.  Psychiatric: He has a normal mood and affect. His behavior is normal. Thought content normal.          Assessment & Plan:  Add Lamictal 50 mg qhs to the Wellbutrin and give me a report in 2 weeks. We will titrate up on the dose if needed. Given a Zpack for the bronchitis.

## 2014-09-01 NOTE — Progress Notes (Signed)
Pre visit review using our clinic review tool, if applicable. No additional management support is needed unless otherwise documented below in the visit note. 

## 2014-09-02 NOTE — Telephone Encounter (Signed)
This was d/c from med list with reason "change in therapy"  Please advise

## 2014-09-05 NOTE — Telephone Encounter (Signed)
What is he using this for? I did not think he was on it now

## 2014-09-06 NOTE — Telephone Encounter (Signed)
I spoke with pt and he does not take this medication.

## 2014-09-22 ENCOUNTER — Telehealth: Payer: Self-pay | Admitting: Family Medicine

## 2014-09-22 MED ORDER — HYDROCODONE-HOMATROPINE 5-1.5 MG/5ML PO SYRP: 5.0000 mL | ORAL_SOLUTION | ORAL | Status: DC | PRN

## 2014-09-22 NOTE — Telephone Encounter (Signed)
Pt still has cough and needs another rx hydromet cough syrup

## 2014-09-22 NOTE — Telephone Encounter (Signed)
done

## 2014-09-23 NOTE — Telephone Encounter (Signed)
Script is ready for pick up and pt is aware. 

## 2014-09-23 NOTE — Telephone Encounter (Signed)
Wife following up on pt's rx request.

## 2014-09-30 ENCOUNTER — Telehealth: Payer: Self-pay | Admitting: Family Medicine

## 2014-09-30 MED ORDER — LORAZEPAM 1 MG PO TABS: 1.0000 mg | ORAL_TABLET | Freq: Four times a day (QID) | ORAL | Status: DC | PRN

## 2014-09-30 NOTE — Telephone Encounter (Signed)
I called in script 

## 2014-09-30 NOTE — Telephone Encounter (Signed)
Call in #[phone] with 2 rf 

## 2014-09-30 NOTE — Telephone Encounter (Signed)
[facility] - [place] - [address] is requesting re-fill on LORazepam (ATIVAN) 1 MG tablet

## 2014-10-25 ENCOUNTER — Telehealth: Payer: Self-pay | Admitting: Family Medicine

## 2014-10-25 NOTE — Telephone Encounter (Signed)
I spoke with pt and went over below information. 

## 2014-10-25 NOTE — Telephone Encounter (Signed)
PA for Viagra was denied.  Medication is a Plan Exclusion, member cannot get any erectile dysfunction medication under their plan.

## 2014-11-16 ENCOUNTER — Ambulatory Visit (INDEPENDENT_AMBULATORY_CARE_PROVIDER_SITE_OTHER): Payer: 59 | Admitting: Family Medicine

## 2014-11-16 ENCOUNTER — Encounter: Payer: Self-pay | Admitting: Family Medicine

## 2014-11-16 VITALS — BP 126/77 | HR 72 | Temp 98.7°F | Ht 73.0 in | Wt 226.0 lb

## 2014-11-16 DIAGNOSIS — F411 Generalized anxiety disorder: Secondary | ICD-10-CM

## 2014-11-16 DIAGNOSIS — K219 Gastro-esophageal reflux disease without esophagitis: Secondary | ICD-10-CM

## 2014-11-16 MED ORDER — LAMOTRIGINE 100 MG PO TABS: 100.0000 mg | ORAL_TABLET | Freq: Every day | ORAL | Status: DC

## 2014-11-16 MED ORDER — ALPRAZOLAM 1 MG PO TABS: 1.0000 mg | ORAL_TABLET | Freq: Three times a day (TID) | ORAL | Status: DC | PRN

## 2014-11-16 MED ORDER — RANITIDINE HCL 150 MG PO TABS: 300.0000 mg | ORAL_TABLET | Freq: Two times a day (BID) | ORAL | Status: DC

## 2014-11-16 NOTE — Progress Notes (Signed)
Pre visit review using our clinic review tool, if applicable. No additional management support is needed unless otherwise documented below in the visit note. 

## 2014-11-16 NOTE — Progress Notes (Signed)
   Subjective:    Patient ID: Justin FlockDaniel [name], male    DOB: [birth year], 34 y.o.   MRN: [id number]  HPI Here to follow up on anxiety and GERD. When we added Lamictal to his Wellbutrin a few months ago this was very helpful. However over the holidays his anxiety has worsened again. He has been taking Ativan 3 or 4 times a day and this only helps for a short time. He sleeps well. His heartburn has been worse as well despite taking Omeprazole daily. He has found that OTC Zantac works much better for him.    Review of Systems  Constitutional: Negative.   Respiratory: Negative.   Cardiovascular: Positive for chest pain. Negative for palpitations and leg swelling.  Psychiatric/Behavioral: Positive for agitation. Negative for hallucinations, confusion, dysphoric mood and decreased concentration. The patient is nervous/anxious.        Objective:   Physical Exam  Constitutional: He is oriented to person, place, and time. He appears well-developed and well-nourished.  Cardiovascular: Normal rate, regular rhythm, normal heart sounds and intact distal pulses.   Pulmonary/Chest: Effort normal and breath sounds normal.  Neurological: He is alert and oriented to person, place, and time.  Psychiatric: He has a normal mood and affect. His behavior is normal. Thought content normal.          Assessment & Plan:  We will stop Omeprazole and switch to Zantac, and he will take 300 mg bid. Switch from Ativan to Xanax to use prn. Increase Lamictal to 100 mg at bedtime. Recheck in 2 weeks

## 2015-01-29 ENCOUNTER — Emergency Department (HOSPITAL_COMMUNITY)
Admission: EM | Admit: 2015-01-29 | Discharge: 2015-01-29 | Disposition: A | Discharge status: Home / Self Care | Payer: 59 | Attending: Emergency Medicine | Admitting: Emergency Medicine

## 2015-01-29 ENCOUNTER — Emergency Department (HOSPITAL_COMMUNITY): Payer: 59

## 2015-01-29 ENCOUNTER — Encounter (HOSPITAL_COMMUNITY): Payer: Self-pay | Admitting: Emergency Medicine

## 2015-01-29 DIAGNOSIS — K219 Gastro-esophageal reflux disease without esophagitis: Secondary | ICD-10-CM | POA: Insufficient documentation

## 2015-01-29 DIAGNOSIS — Z79899 Other long term (current) drug therapy: Secondary | ICD-10-CM | POA: Diagnosis not present

## 2015-01-29 DIAGNOSIS — R51 Headache: Secondary | ICD-10-CM | POA: Insufficient documentation

## 2015-01-29 DIAGNOSIS — F419 Anxiety disorder, unspecified: Secondary | ICD-10-CM | POA: Diagnosis not present

## 2015-01-29 DIAGNOSIS — L731 Pseudofolliculitis barbae: Secondary | ICD-10-CM | POA: Insufficient documentation

## 2015-01-29 DIAGNOSIS — Z720 Tobacco use: Secondary | ICD-10-CM | POA: Diagnosis not present

## 2015-01-29 DIAGNOSIS — R200 Anesthesia of skin: Secondary | ICD-10-CM | POA: Insufficient documentation

## 2015-01-29 DIAGNOSIS — R0789 Other chest pain: Secondary | ICD-10-CM | POA: Diagnosis not present

## 2015-01-29 DIAGNOSIS — F329 Major depressive disorder, single episode, unspecified: Secondary | ICD-10-CM | POA: Insufficient documentation

## 2015-01-29 DIAGNOSIS — R519 Headache, unspecified: Secondary | ICD-10-CM

## 2015-01-29 LAB — CBC
HCT: 46.3 % (ref 39.0–52.0)
Hemoglobin: 15.9 g/dL (ref 13.0–17.0)
MCH: 29.8 pg (ref 26.0–34.0)
MCHC: 34.3 g/dL (ref 30.0–36.0)
MCV: 86.9 fL (ref 78.0–100.0)
Platelets: 186 10*3/uL (ref 150–400)
RBC: 5.33 MIL/uL (ref 4.22–5.81)
RDW: 12.3 % (ref 11.5–15.5)
WBC: 6.6 10*3/uL (ref 4.0–10.5)

## 2015-01-29 LAB — COMPREHENSIVE METABOLIC PANEL
ALT: 43 U/L (ref 0–53)
ANION GAP: 8 (ref 5–15)
AST: 34 U/L (ref 0–37)
Albumin: 4.4 g/dL (ref 3.5–5.2)
Alkaline Phosphatase: 61 U/L (ref 39–117)
BILIRUBIN TOTAL: 1 mg/dL (ref 0.3–1.2)
BUN: 16 mg/dL (ref 6–23)
CALCIUM: 9.2 mg/dL (ref 8.4–10.5)
CHLORIDE: 106 mmol/L (ref 96–112)
CO2: 24 mmol/L (ref 19–32)
CREATININE: 0.92 mg/dL (ref 0.50–1.35)
GFR calc Af Amer: >90 mL/min (ref >90)
GFR calc non Af Amer: >90 mL/min (ref >90)
Glucose, Bld: 77 mg/dL (ref 70–99)
POTASSIUM: 4.4 mmol/L (ref 3.5–5.1)
Sodium: 138 mmol/L (ref 135–145)
TOTAL PROTEIN: 7 g/dL (ref 6.0–8.3)

## 2015-01-29 LAB — TROPONIN I: Troponin I: <0.03 ng/mL (ref <0.031)

## 2015-01-29 LAB — CBG MONITORING, ED: Glucose-Capillary: 75 mg/dL (ref 70–99)

## 2015-01-29 MED ORDER — TRAMADOL HCL 50 MG PO TABS: 50.0000 mg | ORAL_TABLET | Freq: Four times a day (QID) | ORAL | Status: DC | PRN

## 2015-01-29 MED ORDER — LORAZEPAM 1 MG PO TABS: 1.0000 mg | ORAL_TABLET | Freq: Once | ORAL | Status: DC

## 2015-01-29 MED ORDER — TRAMADOL HCL 50 MG PO TABS
50.0000 mg | ORAL_TABLET | Freq: Once | ORAL | Status: AC
  Administered 2015-01-29: 50 mg via ORAL
  Filled 2015-01-29: qty 1

## 2015-01-29 NOTE — Discharge Instructions (Signed)
It was our pleasure to provide your ER care today - we hope that you feel better.  Rest. Drink plenty of fluids.  You may take ultram as need for pain - no driving when taking.  For today's symptoms, follow up with primary care doctor in the next couple days for recheck.  Return to ER if worse, new symptoms, recurrent or persistent chest pain, trouble breathing, fevers, increased swelling/spreading redness around scalp lesion, other concern.  You were given pain medication in the ER - no driving for the next 4 hours.       General Headache Without Cause A headache is pain or discomfort felt around the head or neck area. The specific cause of a headache may not be found. There are many causes and types of headaches. A few common ones are:  Tension headaches.  Migraine headaches.  Cluster headaches.  Chronic daily headaches. HOME CARE INSTRUCTIONS   Keep all follow-up appointments with your caregiver or any specialist referral.  Only take over-the-counter or prescription medicines for pain or discomfort as directed by your caregiver.  Lie down in a dark, quiet room when you have a headache.  Keep a headache journal to find out what may trigger your migraine headaches. For example, write down:  What you eat and drink.  How much sleep you get.  Any change to your diet or medicines.  Try massage or other relaxation techniques.  Put ice packs or heat on the head and neck. Use these 3 to 4 times per day for 15 to 20 minutes each time, or as needed.  Limit stress.  Sit up straight, and do not tense your muscles.  Quit smoking if you smoke.  Limit alcohol use.  Decrease the amount of caffeine you drink, or stop drinking caffeine.  Eat and sleep on a regular schedule.  Get 7 to 9 hours of sleep, or as recommended by your caregiver.  Keep lights dim if bright lights bother you and make your headaches worse. SEEK MEDICAL CARE IF:   You have problems with the  medicines you were prescribed.  Your medicines are not working.  You have a change from the usual headache.  You have nausea or vomiting. SEEK IMMEDIATE MEDICAL CARE IF:   Your headache becomes severe.  You have a fever.  You have a stiff neck.  You have loss of vision.  You have muscular weakness or loss of muscle control.  You start losing your balance or have trouble walking.  You feel faint or pass out.  You have severe symptoms that are different from your first symptoms. MAKE SURE YOU:   Understand these instructions.  Will watch your condition.  Will get help right away if you are not doing well or get worse. Document Released: 10/28/2005 Document Revised: 01/20/2012 Document Reviewed: 11/13/2011 [facility] Patient Information 2015 [place], [place]. This information is not intended to replace advice given to you by your health care provider. Make sure you discuss any questions you have with your health care provider.      Chest Pain (Nonspecific) It is often hard to give a specific diagnosis for the cause of chest pain. There is always a chance that your pain could be related to something serious, such as a heart attack or a blood clot in the lungs. You need to follow up with your health care provider for further evaluation. CAUSES   Heartburn.  Pneumonia or bronchitis.  Anxiety or stress.  Inflammation around your heart (pericarditis)  or lung (pleuritis or pleurisy).  A blood clot in the lung.  A collapsed lung (pneumothorax). It can develop suddenly on its own (spontaneous pneumothorax) or from trauma to the chest.  Shingles infection (herpes zoster virus). The chest wall is composed of bones, muscles, and cartilage. Any of these can be the source of the pain.  The bones can be bruised by injury.  The muscles or cartilage can be strained by coughing or overwork.  The cartilage can be affected by inflammation and become sore  (costochondritis). DIAGNOSIS  Lab tests or other studies may be needed to find the cause of your pain. Your health care provider may have you take a test called an ambulatory electrocardiogram (ECG). An ECG records your heartbeat patterns over a 24-hour period. You may also have other tests, such as:  Transthoracic echocardiogram (TTE). During echocardiography, sound waves are used to evaluate how blood flows through your heart.  Transesophageal echocardiogram (TEE).  Cardiac monitoring. This allows your health care provider to monitor your heart rate and rhythm in real time.  Holter monitor. This is a portable device that records your heartbeat and can help diagnose heart arrhythmias. It allows your health care provider to track your heart activity for several days, if needed.  Stress tests by exercise or by giving medicine that makes the heart beat faster. TREATMENT   Treatment depends on what may be causing your chest pain. Treatment may include:  Acid blockers for heartburn.  Anti-inflammatory medicine.  Pain medicine for inflammatory conditions.  Antibiotics if an infection is present.  You may be advised to change lifestyle habits. This includes stopping smoking and avoiding alcohol, caffeine, and chocolate.  You may be advised to keep your head raised (elevated) when sleeping. This reduces the chance of acid going backward from your stomach into your esophagus. Most of the time, nonspecific chest pain will improve within 2-3 days with rest and mild pain medicine.  HOME CARE INSTRUCTIONS   If antibiotics were prescribed, take them as directed. Finish them even if you start to feel better.  For the next few days, avoid physical activities that bring on chest pain. Continue physical activities as directed.  Do not use any tobacco products, including cigarettes, chewing tobacco, or electronic cigarettes.  Avoid drinking alcohol.  Only take medicine as directed by your health  care provider.  Follow your health care provider's suggestions for further testing if your chest pain does not go away.  Keep any follow-up appointments you made. If you do not go to an appointment, you could develop lasting (chronic) problems with pain. If there is any problem keeping an appointment, call to reschedule. SEEK MEDICAL CARE IF:   Your chest pain does not go away, even after treatment.  You have a rash with blisters on your chest.  You have a fever. SEEK IMMEDIATE MEDICAL CARE IF:   You have increased chest pain or pain that spreads to your arm, neck, jaw, back, or abdomen.  You have shortness of breath.  You have an increasing cough, or you cough up blood.  You have severe back or abdominal pain.  You feel nauseous or vomit.  You have severe weakness.  You faint.  You have chills. This is an emergency. Do not wait to see if the pain will go away. Get medical help at once. Call your local emergency services (911 in [place] Do not drive yourself to the hospital. MAKE SURE YOU:   Understand these instructions.  Will watch  your condition.  Will get help right away if you are not doing well or get worse. Document Released: 08/07/2005 Document Revised: 11/02/2013 Document Reviewed: 06/02/2008 [facility] Patient Information 2015 [place], [place]. This information is not intended to replace advice given to you by your health care provider. Make sure you discuss any questions you have with your health care provider.    Paresthesia Paresthesia is an abnormal burning or prickling sensation. This sensation is generally felt in the hands, arms, legs, or feet. However, it may occur in any part of the body. It is usually not painful. The feeling may be described as:  Tingling or numbness.  "Pins and needles."  Skin crawling.  Buzzing.  Limbs "falling asleep."  Itching. Most people experience temporary (transient) paresthesia at some time in their lives. CAUSES   Paresthesia may occur when you breathe too quickly (hyperventilation). It can also occur without any apparent cause. Commonly, paresthesia occurs when pressure is placed on a nerve. The feeling quickly goes away once the pressure is removed. For some people, however, paresthesia is a long-lasting (chronic) condition caused by an underlying disorder. The underlying disorder may be:  A traumatic, direct injury to nerves. Examples include a:  Broken (fractured) neck.  Fractured skull.  A disorder affecting the brain and spinal cord (central nervous system). Examples include:  Transverse myelitis.  Encephalitis.  Transient ischemic attack.  Multiple sclerosis.  Stroke.  Tumor or blood vessel problems, such as an arteriovenous malformation pressing against the brain or spinal cord.  A condition that damages the peripheral nerves (peripheral neuropathy). Peripheral nerves are not part of the brain and spinal cord. These conditions include:  Diabetes.  Peripheral vascular disease.  Nerve entrapment syndromes, such as carpal tunnel syndrome.  Shingles.  Hypothyroidism.  Vitamin B12 deficiencies.  Alcoholism.  Heavy metal poisoning (lead, arsenic).  Rheumatoid arthritis.  Systemic lupus erythematosus. DIAGNOSIS  Your caregiver will attempt to find the underlying cause of your paresthesia. Your caregiver may:  Take your medical history.  Perform a physical exam.  Order various lab tests.  Order imaging tests. TREATMENT  Treatment for paresthesia depends on the underlying cause. HOME CARE INSTRUCTIONS  Avoid drinking alcohol.  You may consider massage or acupuncture to help relieve your symptoms.  Keep all follow-up appointments as directed by your caregiver. SEEK IMMEDIATE MEDICAL CARE IF:   You feel weak.  You have trouble walking or moving.  You have problems with speech or vision.  You feel confused.  You cannot control your bladder or bowel  movements.  You feel numbness after an injury.  You faint.  Your burning or prickling feeling gets worse when walking.  You have pain, cramps, or dizziness.  You develop a rash. MAKE SURE YOU:  Understand these instructions.  Will watch your condition.  Will get help right away if you are not doing well or get worse. Document Released: 10/18/2002 Document Revised: 01/20/2012 Document Reviewed: 07/19/2011 [facility] Patient Information 2015 [place], [place]. This information is not intended to replace advice given to you by your health care provider. Make sure you discuss any questions you have with your health care provider.    Generalized Anxiety Disorder Generalized anxiety disorder (GAD) is a mental disorder. It interferes with life functions, including relationships, work, and school. GAD is different from normal anxiety, which everyone experiences at some point in their lives in response to specific life events and activities. Normal anxiety actually helps [place] prepare for and get through these life events and  activities. Normal anxiety goes away after the event or activity is over.  GAD causes anxiety that is not necessarily related to specific events or activities. It also causes excess anxiety in proportion to specific events or activities. The anxiety associated with GAD is also difficult to control. GAD can vary from mild to severe. People with severe GAD can have intense waves of anxiety with physical symptoms (panic attacks).  SYMPTOMS The anxiety and worry associated with GAD are difficult to control. This anxiety and worry are related to many life events and activities and also occur more days than not for 6 months or longer. People with GAD also have three or more of the following symptoms (one or more in children):  Restlessness.   Fatigue.  Difficulty concentrating.   Irritability.  Muscle tension.  Difficulty sleeping or unsatisfying sleep. DIAGNOSIS GAD is  diagnosed through an assessment by your health care provider. Your health care provider will ask you questions aboutyour mood,physical symptoms, and events in your life. Your health care provider may ask you about your medical history and use of alcohol or drugs, including prescription medicines. Your health care provider may also do a physical exam and blood tests. Certain medical conditions and the use of certain substances can cause symptoms similar to those associated with GAD. Your health care provider may refer you to a mental health specialist for further evaluation. TREATMENT The following therapies are usually used to treat GAD:   Medication. Antidepressant medication usually is prescribed for long-term daily control. Antianxiety medicines may be added in severe cases, especially when panic attacks occur.   Talk therapy (psychotherapy). Certain types of talk therapy can be helpful in treating GAD by providing support, education, and guidance. A form of talk therapy called cognitive behavioral therapy can teach you healthy ways to think about and react to daily life events and activities.  Stress managementtechniques. These include yoga, meditation, and exercise and can be very helpful when they are practiced regularly. A mental health specialist can help determine which treatment is best for you. Some people see improvement with one therapy. However, other people require a combination of therapies. Document Released: 02/22/2013 Document Revised: 03/14/2014 Document Reviewed: 02/22/2013 [facility] Patient Information 2015 [place]. This information is not intended to replace advice given to you by your health care provider. Make sure you discuss any questions you have with your health care provider.

## 2015-01-29 NOTE — ED Provider Notes (Signed)
CSN: [id number]     Arrival date & time 01/29/15  1609 History   First MD Initiated Contact with Patient 01/29/15 1646     Chief Complaint  Patient presents with  . Dizziness     (Consider location/radiation/quality/duration/timing/severity/associated sxs/prior Treatment) Patient is a 34 y.o. male presenting with dizziness. The history is provided by the patient.  Dizziness Associated symptoms: headaches   Associated symptoms: no chest pain, no shortness of breath and no vomiting   pt c/o initially noting 'knot' left occipital scalp earlier today, localized pain to area. States then had noted dull headache, moderate, gradual onset. Also c/o chest tightness mid chest. Constant, dull, non radiating. At  Rest. No relation to activity or exertion. No associated nv, diaphoresis or sob. States also felt dizzy earlier, which described as mild lightheaded sensation. No syncope. No palpitations. No vertigo. No ear pain or hearing loss. Pt also c/o intermittent right arm numbness/weakness for past 3 weeks. Variable duration, sometimes seconds. Has had normal use of bilateral extremities. No problems w speech or vision. No problems w balance or coordination.       Past Medical History  Diagnosis Date  . Anxiety   . Depression   . GERD (gastroesophageal reflux disease)    History reviewed. No pertinent past surgical history. Family History  Problem Relation Age of Onset  . Diabetes Father   . Heart disease Father   . Hypertension Father    History  Substance Use Topics  . Smoking status: Current Every Day Smoker -- 0.50 packs/day    Types: Cigarettes  . Smokeless tobacco: Never Used     Comment: or less  . Alcohol Use: No    Review of Systems  Constitutional: Negative for fever and chills.  HENT: Negative for sore throat.   Eyes: Negative for pain and visual disturbance.  Respiratory: Negative for shortness of breath.   Cardiovascular: Negative for chest pain.  Gastrointestinal:  Negative for vomiting and abdominal pain.  Endocrine: Negative for polyuria.  Genitourinary: Negative for dysuria and flank pain.  Musculoskeletal: Negative for back pain and neck pain.  Skin: Negative for rash.  Neurological: Positive for dizziness and headaches. Negative for syncope.  Hematological: Does not bruise/bleed easily.  Psychiatric/Behavioral: Negative for confusion.      Allergies  Bee venom  Home Medications   Prior to Admission medications   Medication Sig Start Date End Date Taking? Authorizing Provider  ALPRAZolam Prudy Feeler) 1 MG tablet Take 1 tablet (1 mg total) by mouth 3 (three) times daily as needed for anxiety. Patient taking differently: Take 0.1 mg by mouth 3 (three) times daily.  11/16/14  Yes Nelwyn Salisbury, MD  buPROPion (WELLBUTRIN XL) 150 MG 24 hr tablet Take 150 mg by mouth daily.   Yes Historical Provider, MD  lamoTRIgine (LAMICTAL) 100 MG tablet Take 1 tablet (100 mg total) by mouth daily. 11/16/14  Yes Nelwyn Salisbury, MD  ranitidine (ZANTAC) 150 MG tablet Take 2 tablets (300 mg total) by mouth 2 (two) times daily. 11/16/14  Yes Nelwyn Salisbury, MD  HYDROcodone-homatropine (HYDROMET) 5-1.5 MG/5ML syrup Take 5 mLs by mouth every 4 (four) hours as needed. Patient not taking: Reported on 11/16/2014 09/22/14   Nelwyn Salisbury, MD  sildenafil (VIAGRA) 100 MG tablet Take 1 tablet (100 mg total) by mouth as needed for erectile dysfunction. 09/01/14   Nelwyn Salisbury, MD   BP 135/94 mmHg  Pulse 80  Temp(Src) 98.4 F (36.9 C) (Oral)  Resp 18  SpO2 97% Physical Exam  Constitutional: He is oriented to person, place, and time. He appears well-developed and well-nourished. No distress.  HENT:  Head: Atraumatic.  Mouth/Throat: Oropharynx is clear and moist.  ?tiny ingrown hair left occipital scalp, no drainable abscess. No cellulitis.   Eyes: Conjunctivae and EOM are normal. Pupils are equal, round, and reactive to light.  Neck: Neck supple. No tracheal deviation present. No  thyromegaly present.  No bruit  Cardiovascular: Normal rate, regular rhythm, normal heart sounds and intact distal pulses.  Exam reveals no gallop and no friction rub.   No murmur heard. Pulmonary/Chest: Effort normal and breath sounds normal. No accessory muscle usage. No respiratory distress.  Abdominal: Soft. Bowel sounds are normal. He exhibits no distension. There is no tenderness.  Musculoskeletal: Normal range of motion. He exhibits no edema or tenderness.  Neurological: He is alert and oriented to person, place, and time. No cranial nerve deficit.  Motor intact bil, stre 5/5. sens grossly intact. No pronator drift. Steady gait.   Skin: Skin is warm and dry. He is not diaphoretic.  Psychiatric:  Pt appears very anxious.   Nursing note and vitals reviewed.   ED Course  Procedures (including critical care time) Labs Review  Results for orders placed or performed during the hospital encounter of 01/29/15  CBC  Result Value Ref Range   WBC 6.6 4.0 - 10.5 K/uL   RBC 5.33 4.22 - 5.81 MIL/uL   Hemoglobin 15.9 13.0 - 17.0 g/dL   HCT 40.946.3 81.139.0 - 91.452.0 %   MCV 86.9 78.0 - 100.0 fL   MCH 29.8 26.0 - 34.0 pg   MCHC 34.3 30.0 - 36.0 g/dL   RDW 78.212.3 95.611.5 - 21.315.5 %   Platelets 186 150 - 400 K/uL  Comprehensive metabolic panel  Result Value Ref Range   Sodium 138 135 - 145 mmol/L   Potassium 4.4 3.5 - 5.1 mmol/L   Chloride 106 96 - 112 mmol/L   CO2 24 19 - 32 mmol/L   Glucose, Bld 77 70 - 99 mg/dL   BUN 16 6 - 23 mg/dL   Creatinine, Ser 0.860.92 0.50 - 1.35 mg/dL   Calcium 9.2 8.4 - 57.810.5 mg/dL   Total Protein 7.0 6.0 - 8.3 g/dL   Albumin 4.4 3.5 - 5.2 g/dL   AST 34 0 - 37 U/L   ALT 43 0 - 53 U/L   Alkaline Phosphatase 61 39 - 117 U/L   Total Bilirubin 1.0 0.3 - 1.2 mg/dL   GFR calc non Af Amer >90 >90 mL/min   GFR calc Af Amer >90 >90 mL/min   Anion gap 8 5 - 15  Troponin I  Result Value Ref Range   Troponin I <0.03 <0.031 ng/mL  CBG monitoring, ED  Result Value Ref Range    Glucose-Capillary 75 70 - 99 mg/dL   Ct Head Wo Contrast  01/29/2015   CLINICAL DATA:  Dizziness and blurred vision. Numbness. Gait disturbance.  EXAM: CT HEAD WITHOUT CONTRAST  TECHNIQUE: Contiguous axial images were obtained from the base of the skull through the vertex without intravenous contrast.  COMPARISON:  None.  FINDINGS: No mass lesion. No midline shift. No acute hemorrhage or hematoma. No extra-axial fluid collections. No evidence of acute infarction. Brain parenchyma is normal. There is prominent mucosal thickening in the right maxillary sinus but there is no air-fluid level. This is most likely chronic. The other sinuses are clear.  IMPRESSION: No significant abnormality.   Electronically Signed  By: Francene Boyers M.D.   On: 01/29/2015 18:15       EKG Interpretation   Date/Time:  Sunday January 29 2015 16:57:44 EDT Ventricular Rate:  75 PR Interval:  167 QRS Duration: 110 QT Interval:  375 QTC Calculation: 419 R Axis:   72 Text Interpretation:  Sinus rhythm No significant change since last  tracing Confirmed by Cherlyn Syring  MD, Caryn Bee ([id number]) on 01/29/2015 5:33:59 PM      MDM   Iv ns. Labs. Ecg.   Reviewed nursing notes and prior charts for additional history.   Pt with hx anxiety, currently appears very anxious. Ativan po.  Suspect multitude of various symptoms related in part to anxiety.  Recheck, ED workup/tests unremarkable. Discussed w pt.  Pt calmer, less anxious, and appears in no acute distress.  Pt currently appears stable for d/c.  Recommend close pcp f/u.     Cathren Laine, MD 01/29/15 Mikle Bosworth

## 2015-01-29 NOTE — ED Notes (Signed)
Awake. Verbally responsive. A/O x4. Resp even and unlabored. No audible adventitious breath sounds noted. ABC's intact.  

## 2015-01-29 NOTE — ED Notes (Signed)
Patient in with complaints of dizziness, blurred vision, and knot on back of head with no injury. States that he is having chest tightness related to his anxiety. Denies n/v/d.

## 2015-01-29 NOTE — ED Notes (Signed)
Awake. Verbally responsive. A/O x4. Resp even and unlabored. No audible adventitious breath sounds noted. ABC's intact. SR on monitor at 73bpm. Family at bedside. IV saline lock patent and intact.

## 2015-01-31 ENCOUNTER — Encounter: Payer: Self-pay | Admitting: Family Medicine

## 2015-01-31 ENCOUNTER — Ambulatory Visit (INDEPENDENT_AMBULATORY_CARE_PROVIDER_SITE_OTHER): Payer: 59 | Admitting: Family Medicine

## 2015-01-31 VITALS — BP 125/84 | HR 76 | Temp 98.1°F | Ht 73.0 in | Wt 241.0 lb

## 2015-01-31 DIAGNOSIS — S61412A Laceration without foreign body of left hand, initial encounter: Secondary | ICD-10-CM | POA: Diagnosis not present

## 2015-01-31 DIAGNOSIS — B029 Zoster without complications: Secondary | ICD-10-CM

## 2015-01-31 DIAGNOSIS — Z23 Encounter for immunization: Secondary | ICD-10-CM | POA: Diagnosis not present

## 2015-01-31 MED ORDER — VALACYCLOVIR HCL 1 G PO TABS: 1000.0000 mg | ORAL_TABLET | Freq: Three times a day (TID) | ORAL | Status: DC

## 2015-01-31 MED ORDER — HYDROCODONE-ACETAMINOPHEN 10-325 MG PO TABS: 1.0000 | ORAL_TABLET | Freq: Four times a day (QID) | ORAL | Status: DC | PRN

## 2015-01-31 MED ORDER — CEPHALEXIN 500 MG PO CAPS: 500.0000 mg | ORAL_CAPSULE | Freq: Three times a day (TID) | ORAL | Status: AC

## 2015-01-31 NOTE — Progress Notes (Deleted)
Pre visit review using our clinic review tool, if applicable. No additional management support is needed unless otherwise documented below in the visit note. 

## 2015-01-31 NOTE — Progress Notes (Signed)
   Subjective:    Patient ID: Justin [name], male    DOB: 01/07/1981, 33 y.o.   MRN: [id number]  HPI Here to follow up on a visit to the ED on 01-29-15 for a multitude of symptoms including a small lump on the left occipital scalp. He also complained of dizziness, weakness and numbness of both arms, and a headache. He had a normal CT of the head and normal labs. He was told he had an "ingrown hair" and that his anxiety was not well controlled. Since then he has continued to have a left sided headache, and he describes burning, tingling, and itching on the left side of the posterior scalp. In addition to this, yesterday while trying to cut a piece of copper wiring with a razor blade his hand slipped, causing the corner of the blade to puncture his left hand. He cleaned it and applied Neosporin, and he wrapped it with gauze. Today the area is painful.    Review of Systems  Constitutional: Negative.   Respiratory: Negative.   Cardiovascular: Negative.   Skin: Positive for wound.  Neurological: Positive for headaches.       Objective:   Physical Exam  Constitutional: He is oriented to person, place, and time. He appears well-developed and well-nourished. No distress.  HENT:  He has 2 well defined red papules in the scalp, one on the left temple and one on the left occiput. These areas a re tender to touch. There is a large tender occipital lymph node on the left side.   Eyes: Conjunctivae and EOM are normal. Pupils are equal, round, and reactive to light.  Neck: Neck supple.  Cardiovascular: Normal rate, regular rhythm, normal heart sounds and intact distal pulses.   Pulmonary/Chest: Effort normal and breath sounds normal.  Lymphadenopathy:    He has cervical adenopathy.  Neurological: He is alert and oriented to person, place, and time. No cranial nerve deficit. He exhibits normal muscle tone. Coordination normal.  Skin:  There is a shallow puncture wound on the left hand in the anatomical  snuffbox. This area is swollen and tender, not red or warm. Fingers have full ROM           Assessment & Plan:  He has shingles in the scalp with some reactive lymph nodes. Given Valtrex 1000 mg tid for 10 days and he can use Norco for pain. He has some early cellulitis of the hand and he will start on Keflex 500 mg tid as well.  

## 2015-01-31 NOTE — Progress Notes (Signed)
   Subjective:    Patient ID: Justin [name], male    DOB: 09-03-1981, 34 y.o.   MRN: [id number]  HPI See my note above   Review of Systems  Constitutional: Negative.        Objective:   Physical Exam  Constitutional:  See my note above          Assessment & Plan:  See my note above

## 2015-01-31 NOTE — Progress Notes (Deleted)
   Subjective:    Patient ID: Justin [name], male    DOB: 02/24/1981, 33 y.o.   MRN: [id number]  HPI Here to follow up on a visit to the ED on 01-29-15 for a multitude of symptoms including a small lump on the left occipital scalp. He also complained of dizziness, weakness and numbness of both arms, and a headache. He had a normal CT of the head and normal labs. He was told he had an "ingrown hair" and that his anxiety was not well controlled. Since then he has continued to have a left sided headache, and he describes burning, tingling, and itching on the left side of the posterior scalp. In addition to this, yesterday while trying to cut a piece of copper wiring with a razor blade his hand slipped, causing the corner of the blade to puncture his left hand. He cleaned it and applied Neosporin, and he wrapped it with gauze. Today the area is painful.    Review of Systems  Constitutional: Negative.   Respiratory: Negative.   Cardiovascular: Negative.   Skin: Positive for wound.  Neurological: Positive for headaches.       Objective:   Physical Exam  Constitutional: He is oriented to person, place, and time. He appears well-developed and well-nourished. No distress.  HENT:  He has 2 well defined red papules in the scalp, one on the left temple and one on the left occiput. These areas a re tender to touch. There is a large tender occipital lymph node on the left side.   Eyes: Conjunctivae and EOM are normal. Pupils are equal, round, and reactive to light.  Neck: Neck supple.  Cardiovascular: Normal rate, regular rhythm, normal heart sounds and intact distal pulses.   Pulmonary/Chest: Effort normal and breath sounds normal.  Lymphadenopathy:    He has cervical adenopathy.  Neurological: He is alert and oriented to person, place, and time. No cranial nerve deficit. He exhibits normal muscle tone. Coordination normal.  Skin:  There is a shallow puncture wound on the left hand in the anatomical  snuffbox. This area is swollen and tender, not red or warm. Fingers have full ROM           Assessment & Plan:  He has shingles in the scalp with some reactive lymph nodes. Given Valtrex 1000 mg tid for 10 days and he can use Norco for pain. He has some early cellulitis of the hand and he will start on Keflex 500 mg tid as well.  

## 2015-01-31 NOTE — Progress Notes (Signed)
   Subjective:    Patient ID: Justin [name], male    DOB: 04/13/1981, 33 y.o.   MRN: [id number]  HPI Here to follow up on a visit to the ED on 01-29-15 for a multitude of symptoms including a small lump on the left occipital scalp. He also complained of dizziness, weakness and numbness of both arms, and a headache. He had a normal CT of the head and normal labs. He was told he had an "ingrown hair" and that his anxiety was not well controlled. Since then he has continued to have a left sided headache, and he describes burning, tingling, and itching on the left side of the posterior scalp. In addition to this, yesterday while trying to cut a piece of copper wiring with a razor blade his hand slipped, causing the corner of the blade to puncture his left hand. He cleaned it and applied Neosporin, and he wrapped it with gauze. Today the area is painful.    Review of Systems  Constitutional: Negative.   Respiratory: Negative.   Cardiovascular: Negative.   Skin: Positive for wound.  Neurological: Positive for headaches.       Objective:   Physical Exam  Constitutional: He is oriented to person, place, and time. He appears well-developed and well-nourished. No distress.  HENT:  He has 2 well defined red papules in the scalp, one on the left temple and one on the left occiput. These areas a re tender to touch. There is a large tender occipital lymph node on the left side.   Eyes: Conjunctivae and EOM are normal. Pupils are equal, round, and reactive to light.  Neck: Neck supple.  Cardiovascular: Normal rate, regular rhythm, normal heart sounds and intact distal pulses.   Pulmonary/Chest: Effort normal and breath sounds normal.  Lymphadenopathy:    He has cervical adenopathy.  Neurological: He is alert and oriented to person, place, and time. No cranial nerve deficit. He exhibits normal muscle tone. Coordination normal.  Skin:  There is a shallow puncture wound on the left hand in the anatomical  snuffbox. This area is swollen and tender, not red or warm. Fingers have full ROM           Assessment & Plan:  He has shingles in the scalp with some reactive lymph nodes. Given Valtrex 1000 mg tid for 10 days and he can use Norco for pain. He has some early cellulitis of the hand and he will start on Keflex 500 mg tid as well.  

## 2015-01-31 NOTE — Progress Notes (Signed)
   Subjective:    Patient ID: Justin [name], male    DOB: 08-Jul-1981, 34 y.o.   MRN: [id number]  HPI Here to follow up on a visit to the ED on 01-29-15 for a multitude of symptoms including a small lump on the left occipital scalp. He also complained of dizziness, weakness and numbness of both arms, and a headache. He had a normal CT of the head and normal labs. He was told he had an "ingrown hair" and that his anxiety was not well controlled. Since then he has continued to have a left sided headache, and he describes burning, tingling, and itching on the left side of the posterior scalp. In addition to this, yesterday while trying to cut a piece of copper wiring with a razor blade his hand slipped, causing the corner of the blade to puncture his left hand. He cleaned it and applied Neosporin, and he wrapped it with gauze. Today the area is painful.    Review of Systems  Constitutional: Negative.   Respiratory: Negative.   Cardiovascular: Negative.   Skin: Positive for wound.  Neurological: Positive for headaches.       Objective:   Physical Exam  Constitutional: He is oriented to person, place, and time. He appears well-developed and well-nourished. No distress.  HENT:  He has 2 well defined red papules in the scalp, one on the left temple and one on the left occiput. These areas a re tender to touch. There is a large tender occipital lymph node on the left side.   Eyes: Conjunctivae and EOM are normal. Pupils are equal, round, and reactive to light.  Neck: Neck supple.  Cardiovascular: Normal rate, regular rhythm, normal heart sounds and intact distal pulses.   Pulmonary/Chest: Effort normal and breath sounds normal.  Lymphadenopathy:    He has cervical adenopathy.  Neurological: He is alert and oriented to person, place, and time. No cranial nerve deficit. He exhibits normal muscle tone. Coordination normal.  Skin:  There is a shallow puncture wound on the left hand in the anatomical  snuffbox. This area is swollen and tender, not red or warm. Fingers have full ROM           Assessment & Plan:  He has shingles in the scalp with some reactive lymph nodes. Given Valtrex 1000 mg tid for 10 days and he can use Norco for pain. He has some early cellulitis of the hand and he will start on Keflex 500 mg tid as well.

## 2015-03-03 ENCOUNTER — Telehealth: Payer: Self-pay | Admitting: Family Medicine

## 2015-03-03 NOTE — Telephone Encounter (Signed)
Refill request for Lamotrigine & Alprazolam, send to [facility].

## 2015-03-06 MED ORDER — ALPRAZOLAM 1 MG PO TABS: 1.0000 mg | ORAL_TABLET | Freq: Three times a day (TID) | ORAL | Status: DC | PRN

## 2015-03-06 MED ORDER — LAMOTRIGINE 100 MG PO TABS: 100.0000 mg | ORAL_TABLET | Freq: Every day | ORAL | Status: DC

## 2015-03-06 NOTE — Telephone Encounter (Signed)
Call in Lamotrigine #30 with 5 rf, also Xanax #90 with 5 rf

## 2015-03-06 NOTE — Telephone Encounter (Signed)
Rx called in to pharmacy. 

## 2015-04-03 ENCOUNTER — Telehealth: Payer: Self-pay

## 2015-04-03 NOTE — Telephone Encounter (Signed)
[facility] [place] - [address]: buPROPion (WELLBUTRIN XL) 150 MG 24 hr tablet

## 2015-04-03 NOTE — Telephone Encounter (Signed)
Refill for one year 

## 2015-04-04 MED ORDER — BUPROPION HCL ER (XL) 150 MG PO TB24: 150.0000 mg | ORAL_TABLET | Freq: Every day | ORAL | Status: DC

## 2015-04-04 NOTE — Telephone Encounter (Signed)
I sent script e-scribe. 

## 2015-08-03 ENCOUNTER — Telehealth: Payer: Self-pay | Admitting: Family Medicine

## 2015-08-03 NOTE — Telephone Encounter (Signed)
Pt call to say that he think someone told some of his medicine/ He said he was counting his pills and they are not correct. He said he is going out of town and only has enough to las him through the weekend.   Phone number [phone]

## 2015-08-03 NOTE — Telephone Encounter (Signed)
Sorry Prudy Feeler

## 2015-08-03 NOTE — Telephone Encounter (Signed)
Pt said they told him at the pharmacy that his next refill is not till 07/3014. He said he is going out of town and does not have enough pills to last l

## 2015-08-03 NOTE — Telephone Encounter (Signed)
His next refill should be available for 08-06-15 so ask the pharmacy to refill this a few days early

## 2015-08-03 NOTE — Telephone Encounter (Signed)
What medication is he requesting ?

## 2015-08-04 NOTE — Telephone Encounter (Signed)
I spoke with pharmacy and pt can get script filled a few days early, not a whole week. I spoke with pt and he will get the rest of refills left from [facility] and then change back pharmacies.

## 2015-08-04 NOTE — Telephone Encounter (Signed)
Pt thanks you for your help. He would like to transfer this Rx back to [facility] if possible. There was an insurance problem with his last fill. He is leaving Sunday night or possible Monday morning to go out of town.

## 2015-08-31 ENCOUNTER — Other Ambulatory Visit: Payer: Self-pay | Admitting: Family Medicine

## 2015-08-31 NOTE — Telephone Encounter (Signed)
Please advise of okay to refill?

## 2015-08-31 NOTE — Telephone Encounter (Signed)
[facility] - [place] - [address] [phone]  Requesting refill of lamoTRIgine (LAMICTAL) 100 MG tablet

## 2015-08-31 NOTE — Telephone Encounter (Signed)
Call in Lamictal #30 with 5rf, also Xanax #90 with 5rf

## 2015-08-31 NOTE — Telephone Encounter (Signed)
Pt request refill ALPRAZolam (XANAX) 1 MG tablet Please send to [facility]   Please change all refill to [place]  Pt going out of town on Monday for the week.

## 2015-09-01 MED ORDER — LAMOTRIGINE 100 MG PO TABS: 100.0000 mg | ORAL_TABLET | Freq: Every day | ORAL | Status: DC

## 2015-09-01 MED ORDER — ALPRAZOLAM 1 MG PO TABS: 1.0000 mg | ORAL_TABLET | Freq: Three times a day (TID) | ORAL | Status: DC | PRN

## 2015-09-01 NOTE — Telephone Encounter (Signed)
Called and spoke with pt and pt is aware.  

## 2015-09-01 NOTE — Telephone Encounter (Signed)
Both prescriptions sent to [facility].

## 2015-10-31 ENCOUNTER — Encounter: Payer: Self-pay | Admitting: Family Medicine

## 2015-10-31 ENCOUNTER — Ambulatory Visit (INDEPENDENT_AMBULATORY_CARE_PROVIDER_SITE_OTHER): Payer: 59 | Admitting: Family Medicine

## 2015-10-31 VITALS — BP 122/83 | HR 93 | Temp 98.3°F | Ht 73.0 in | Wt 237.0 lb

## 2015-10-31 DIAGNOSIS — R1310 Dysphagia, unspecified: Secondary | ICD-10-CM

## 2015-10-31 DIAGNOSIS — F329 Major depressive disorder, single episode, unspecified: Secondary | ICD-10-CM | POA: Diagnosis not present

## 2015-10-31 DIAGNOSIS — F411 Generalized anxiety disorder: Secondary | ICD-10-CM | POA: Diagnosis not present

## 2015-10-31 DIAGNOSIS — F32A Depression, unspecified: Secondary | ICD-10-CM

## 2015-10-31 MED ORDER — BUPROPION HCL ER (XL) 150 MG PO TB24: 150.0000 mg | ORAL_TABLET | Freq: Every day | ORAL | Status: DC

## 2015-10-31 MED ORDER — LAMOTRIGINE 100 MG PO TABS: 100.0000 mg | ORAL_TABLET | Freq: Every day | ORAL | Status: DC

## 2015-10-31 MED ORDER — ALPRAZOLAM 1 MG PO TABS: 1.0000 mg | ORAL_TABLET | Freq: Four times a day (QID) | ORAL | Status: DC | PRN

## 2015-10-31 NOTE — Progress Notes (Signed)
Pre visit review using our clinic review tool, if applicable. No additional management support is needed unless otherwise documented below in the visit note. 

## 2015-10-31 NOTE — Progress Notes (Signed)
   Subjective:    Patient ID: Justin FlockDaniel [name], male    DOB: 07/05/1981, 34 y.o.   MRN: [id number]  HPI Here to discuss his anxiety meds. He has been doing well in general and he sleeps well. However he has a problem. The family recently took a trip to [place], [place] and he had his prescriptions transferred to a [facility] pharmacy there. Unfortunately these were put in the system as for a "Justin [name]", and he is worried that his local pharmacy in [place] now will not fill these. Also he describes trouble swallowing that started about 6 months ago. This is intermittent. He says food sometimes hangs up in the back of the throat and he has to work to get it down. No throat pain. His GERD sx are controlled with Zantac.    Review of Systems  Constitutional: Negative.   Respiratory: Negative.   Cardiovascular: Negative.   Gastrointestinal: Negative for nausea, vomiting and abdominal pain.  Neurological: Negative.   Psychiatric/Behavioral: Negative for confusion, dysphoric mood and decreased concentration. The patient is nervous/anxious.        Objective:   Physical Exam  Constitutional: He is oriented to person, place, and time. He appears well-developed and well-nourished.  Neck: Neck supple. No thyromegaly present.  Cardiovascular: Normal rate, regular rhythm, normal heart sounds and intact distal pulses.   Pulmonary/Chest: Effort normal and breath sounds normal.  Lymphadenopathy:    He has no cervical adenopathy.  Neurological: He is alert and oriented to person, place, and time.  Psychiatric: He has a normal mood and affect. His behavior is normal. Thought content normal.          Assessment & Plan:  His anxiety is well controlled. We will call the [place] pharmacy to cancel all remaining refills, and we will call in new rx for his meds to the [place] [facility]. Refer to GI for the dysphagia.

## 2015-11-14 ENCOUNTER — Other Ambulatory Visit (INDEPENDENT_AMBULATORY_CARE_PROVIDER_SITE_OTHER): Payer: Self-pay

## 2015-11-14 DIAGNOSIS — Z0000 Encounter for general adult medical examination without abnormal findings: Secondary | ICD-10-CM

## 2015-11-14 LAB — CBC WITH DIFFERENTIAL/PLATELET
Basophils Absolute: 0 10*3/uL (ref 0.0–0.1)
Basophils Relative: 0.4 % (ref 0.0–3.0)
Eosinophils Absolute: 0.2 10*3/uL (ref 0.0–0.7)
Eosinophils Relative: 1.5 % (ref 0.0–5.0)
HEMATOCRIT: 48.1 % (ref 39.0–52.0)
HEMOGLOBIN: 15.8 g/dL (ref 13.0–17.0)
LYMPHS PCT: 19.4 % (ref 12.0–46.0)
Lymphs Abs: 2.4 10*3/uL (ref 0.7–4.0)
MCHC: 32.8 g/dL (ref 30.0–36.0)
MCV: 89 fl (ref 78.0–100.0)
Monocytes Absolute: 0.8 10*3/uL (ref 0.1–1.0)
Monocytes Relative: 6.4 % (ref 3.0–12.0)
Neutro Abs: 8.9 10*3/uL — ABNORMAL HIGH (ref 1.4–7.7)
Neutrophils Relative %: 72.3 % (ref 43.0–77.0)
Platelets: 230 10*3/uL (ref 150.0–400.0)
RBC: 5.41 Mil/uL (ref 4.22–5.81)
RDW: 12.6 % (ref 11.5–15.5)
WBC: 12.3 10*3/uL — ABNORMAL HIGH (ref 4.0–10.5)

## 2015-11-14 LAB — POCT URINALYSIS DIPSTICK
BILIRUBIN UA: NEGATIVE
Blood, UA: NEGATIVE
Glucose, UA: NEGATIVE
KETONES UA: NEGATIVE
LEUKOCYTES UA: NEGATIVE
Nitrite, UA: NEGATIVE
PH UA: 6
Protein, UA: NEGATIVE
Spec Grav, UA: 1.02
Urobilinogen, UA: 0.2

## 2015-11-14 LAB — HEPATIC FUNCTION PANEL
ALT: 47 U/L (ref 0–53)
AST: 22 U/L (ref 0–37)
Albumin: 4.4 g/dL (ref 3.5–5.2)
Alkaline Phosphatase: 65 U/L (ref 39–117)
Bilirubin, Direct: 0.1 mg/dL (ref 0.0–0.3)
TOTAL PROTEIN: 6.7 g/dL (ref 6.0–8.3)
Total Bilirubin: 0.4 mg/dL (ref 0.2–1.2)

## 2015-11-14 LAB — BASIC METABOLIC PANEL
BUN: 10 mg/dL (ref 6–23)
CHLORIDE: 103 meq/L (ref 96–112)
CO2: 27 mEq/L (ref 19–32)
Calcium: 9.8 mg/dL (ref 8.4–10.5)
Creatinine, Ser: 0.93 mg/dL (ref 0.40–1.50)
GFR: 98.8 mL/min (ref >60.00)
Glucose, Bld: 97 mg/dL (ref 70–99)
Potassium: 4.3 mEq/L (ref 3.5–5.1)
Sodium: 138 mEq/L (ref 135–145)

## 2015-11-14 LAB — LIPID PANEL
CHOL/HDL RATIO: 4
Cholesterol: 182 mg/dL (ref 0–200)
HDL: 41.5 mg/dL (ref >39.00)
LDL Cholesterol: 107 mg/dL — ABNORMAL HIGH (ref 0–99)
NONHDL: 140.82
Triglycerides: 171 mg/dL — ABNORMAL HIGH (ref 0.0–149.0)
VLDL: 34.2 mg/dL (ref 0.0–40.0)

## 2015-11-14 LAB — TSH: TSH: 1.38 u[IU]/mL (ref 0.35–4.50)

## 2015-11-20 ENCOUNTER — Encounter: Payer: 59 | Admitting: Family Medicine

## 2015-12-01 ENCOUNTER — Encounter: Payer: Self-pay | Admitting: Internal Medicine

## 2015-12-06 ENCOUNTER — Encounter: Payer: Self-pay | Admitting: Family Medicine

## 2015-12-06 ENCOUNTER — Ambulatory Visit (INDEPENDENT_AMBULATORY_CARE_PROVIDER_SITE_OTHER): Payer: BLUE CROSS/BLUE SHIELD | Admitting: Family Medicine

## 2015-12-06 VITALS — BP 110/62 | HR 78 | Temp 97.7°F | Ht 73.0 in | Wt 243.0 lb

## 2015-12-06 DIAGNOSIS — Z0000 Encounter for general adult medical examination without abnormal findings: Secondary | ICD-10-CM

## 2015-12-06 NOTE — Progress Notes (Signed)
Pre visit review using our clinic review tool, if applicable. No additional management support is needed unless otherwise documented below in the visit note. 

## 2015-12-06 NOTE — Progress Notes (Signed)
   Subjective:    Patient ID: Justin [name], male    DOB: Jun 20, 1981, 35 y.o.   MRN: [id number]  HPI 35 yr old male for a cpx. He feels well in general and his depression and anxiety are well controlled.    Review of Systems  Constitutional: Negative.   HENT: Negative.   Eyes: Negative.   Respiratory: Negative.   Cardiovascular: Negative.   Gastrointestinal: Negative.   Genitourinary: Negative.   Musculoskeletal: Negative.   Skin: Negative.   Neurological: Negative.   Psychiatric/Behavioral: Negative.        Objective:   Physical Exam  Constitutional: He is oriented to person, place, and time. He appears well-developed and well-nourished. No distress.  HENT:  Head: Normocephalic and atraumatic.  Right Ear: External ear normal.  Left Ear: External ear normal.  Nose: Nose normal.  Mouth/Throat: Oropharynx is clear and moist. No oropharyngeal exudate.  Eyes: Conjunctivae and EOM are normal. Pupils are equal, round, and reactive to light. Right eye exhibits no discharge. Left eye exhibits no discharge. No scleral icterus.  Neck: Neck supple. No JVD present. No tracheal deviation present. No thyromegaly present.  Cardiovascular: Normal rate, regular rhythm, normal heart sounds and intact distal pulses.  Exam reveals no gallop and no friction rub.   No murmur heard. Pulmonary/Chest: Effort normal and breath sounds normal. No respiratory distress. He has no wheezes. He has no rales. He exhibits no tenderness.  Abdominal: Soft. Bowel sounds are normal. He exhibits no distension and no mass. There is no tenderness. There is no rebound and no guarding.  Genitourinary: Rectum normal, prostate normal and penis normal. Guaiac negative stool. No penile tenderness.  Musculoskeletal: Normal range of motion. He exhibits no edema or tenderness.  Lymphadenopathy:    He has no cervical adenopathy.  Neurological: He is alert and oriented to person, place, and time. He has normal reflexes. No  cranial nerve deficit. He exhibits normal muscle tone. Coordination normal.  Skin: Skin is warm and dry. No rash noted. He is not diaphoretic. No erythema. No pallor.  Psychiatric: He has a normal mood and affect. His behavior is normal. Judgment and thought content normal.          Assessment & Plan:  Well exam. We discussed diet and exercise .

## 2016-04-01 ENCOUNTER — Telehealth: Payer: Self-pay

## 2016-04-01 ENCOUNTER — Other Ambulatory Visit: Payer: Self-pay

## 2016-04-01 MED ORDER — LAMOTRIGINE 100 MG PO TABS: 100.0000 mg | ORAL_TABLET | Freq: Every day | ORAL | Status: DC

## 2016-04-01 NOTE — Telephone Encounter (Signed)
Call in #120 with 5 rf 

## 2016-04-01 NOTE — Telephone Encounter (Signed)
Pt is requesting a refill on the xanax. Last filled on 10/31/15 #120 with  5 additional refills. Last OV 12/06/15, okay to refill?

## 2016-04-02 MED ORDER — ALPRAZOLAM 1 MG PO TABS: 1.0000 mg | ORAL_TABLET | Freq: Four times a day (QID) | ORAL | Status: DC | PRN

## 2016-04-02 NOTE — Telephone Encounter (Signed)
I called in script 

## 2016-10-02 ENCOUNTER — Emergency Department (HOSPITAL_COMMUNITY): Payer: No Typology Code available for payment source

## 2016-10-02 ENCOUNTER — Emergency Department (HOSPITAL_COMMUNITY)
Admission: EM | Admit: 2016-10-02 | Discharge: 2016-10-02 | Disposition: A | Discharge status: Home / Self Care | Payer: No Typology Code available for payment source | Attending: Emergency Medicine | Admitting: Emergency Medicine

## 2016-10-02 ENCOUNTER — Encounter (HOSPITAL_COMMUNITY): Payer: Self-pay | Admitting: Emergency Medicine

## 2016-10-02 DIAGNOSIS — Z79899 Other long term (current) drug therapy: Secondary | ICD-10-CM | POA: Diagnosis not present

## 2016-10-02 DIAGNOSIS — F17210 Nicotine dependence, cigarettes, uncomplicated: Secondary | ICD-10-CM | POA: Diagnosis not present

## 2016-10-02 DIAGNOSIS — N200 Calculus of kidney: Secondary | ICD-10-CM | POA: Insufficient documentation

## 2016-10-02 DIAGNOSIS — R109 Unspecified abdominal pain: Secondary | ICD-10-CM | POA: Diagnosis present

## 2016-10-02 LAB — COMPREHENSIVE METABOLIC PANEL
ALK PHOS: 73 U/L (ref 38–126)
ALT: 38 U/L (ref 17–63)
AST: 24 U/L (ref 15–41)
Albumin: 4.6 g/dL (ref 3.5–5.0)
Anion gap: 6 (ref 5–15)
BILIRUBIN TOTAL: 0.8 mg/dL (ref 0.3–1.2)
BUN: 19 mg/dL (ref 6–20)
CO2: 29 mmol/L (ref 22–32)
CREATININE: 1.18 mg/dL (ref 0.61–1.24)
Calcium: 9.1 mg/dL (ref 8.9–10.3)
Chloride: 104 mmol/L (ref 101–111)
GFR calc Af Amer: >60 mL/min (ref >60)
Glucose, Bld: 133 mg/dL — ABNORMAL HIGH (ref 65–99)
Potassium: 4.1 mmol/L (ref 3.5–5.1)
Sodium: 139 mmol/L (ref 135–145)
Total Protein: 7 g/dL (ref 6.5–8.1)

## 2016-10-02 LAB — URINALYSIS, ROUTINE W REFLEX MICROSCOPIC
Bilirubin Urine: NEGATIVE
Glucose, UA: NEGATIVE mg/dL
Ketones, ur: NEGATIVE mg/dL
Leukocytes, UA: NEGATIVE
Nitrite: NEGATIVE
PROTEIN: 30 mg/dL — AB
Specific Gravity, Urine: 1.024 (ref 1.005–1.030)
pH: 6 (ref 5.0–8.0)

## 2016-10-02 LAB — CBC WITH DIFFERENTIAL/PLATELET
BASOS ABS: 0 10*3/uL (ref 0.0–0.1)
Basophils Relative: 0 %
Eosinophils Absolute: 0.1 10*3/uL (ref 0.0–0.7)
Eosinophils Relative: 0 %
HEMATOCRIT: 43 % (ref 39.0–52.0)
HEMOGLOBIN: 14.8 g/dL (ref 13.0–17.0)
LYMPHS PCT: 12 %
Lymphs Abs: 1.4 10*3/uL (ref 0.7–4.0)
MCH: 29.7 pg (ref 26.0–34.0)
MCHC: 34.4 g/dL (ref 30.0–36.0)
MCV: 86.2 fL (ref 78.0–100.0)
Monocytes Absolute: 0.7 10*3/uL (ref 0.1–1.0)
Monocytes Relative: 6 %
NEUTROS PCT: 82 %
Neutro Abs: 9 10*3/uL — ABNORMAL HIGH (ref 1.7–7.7)
Platelets: 193 10*3/uL (ref 150–400)
RBC: 4.99 MIL/uL (ref 4.22–5.81)
RDW: 12.2 % (ref 11.5–15.5)
WBC: 11.1 10*3/uL — AB (ref 4.0–10.5)

## 2016-10-02 LAB — URINE MICROSCOPIC-ADD ON
Bacteria, UA: NONE SEEN
SQUAMOUS EPITHELIAL / LPF: NONE SEEN
WBC, UA: NONE SEEN WBC/hpf (ref 0–5)

## 2016-10-02 MED ORDER — SODIUM CHLORIDE 0.9 % IV BOLUS (SEPSIS)
1000.0000 mL | Freq: Once | INTRAVENOUS | Status: AC
  Administered 2016-10-02: 1000 mL via INTRAVENOUS

## 2016-10-02 MED ORDER — ONDANSETRON HCL 4 MG/2ML IJ SOLN
4.0000 mg | Freq: Once | INTRAMUSCULAR | Status: AC
  Administered 2016-10-02: 4 mg via INTRAVENOUS
  Filled 2016-10-02: qty 2

## 2016-10-02 MED ORDER — KETOROLAC TROMETHAMINE 30 MG/ML IJ SOLN
30.0000 mg | Freq: Once | INTRAMUSCULAR | Status: AC
  Administered 2016-10-02: 30 mg via INTRAVENOUS
  Filled 2016-10-02: qty 1

## 2016-10-02 MED ORDER — OXYCODONE-ACETAMINOPHEN 5-325 MG PO TABS: 1.0000 | ORAL_TABLET | Freq: Four times a day (QID) | ORAL | 0 refills | Status: DC | PRN

## 2016-10-02 MED ORDER — HYDROMORPHONE HCL 1 MG/ML IJ SOLN
0.5000 mg | Freq: Once | INTRAMUSCULAR | Status: AC
  Administered 2016-10-02: 0.5 mg via INTRAVENOUS
  Filled 2016-10-02: qty 1

## 2016-10-02 MED ORDER — HYDROMORPHONE HCL 1 MG/ML IJ SOLN
1.0000 mg | Freq: Once | INTRAMUSCULAR | Status: AC
  Administered 2016-10-02: 1 mg via INTRAVENOUS
  Filled 2016-10-02: qty 1

## 2016-10-02 MED ORDER — TAMSULOSIN HCL 0.4 MG PO CAPS: 0.4000 mg | ORAL_CAPSULE | Freq: Every day | ORAL | 0 refills | Status: DC

## 2016-10-02 MED ORDER — ONDANSETRON 4 MG PO TBDP: ORAL_TABLET | ORAL | 0 refills | Status: DC

## 2016-10-02 NOTE — ED Triage Notes (Signed)
Patient is from home.  Patient states he has right flank pain that radiates to the RLQ.  Patient states his pain is 8 out of 10. Patient has history of depression and history of chronic knee pain.    EMS administered of Fentanyl and 8 mg of Zofran.  BP:130/93 HR:57 O2: 95% on room air

## 2016-10-02 NOTE — ED Notes (Signed)
Bed: ZO10WA05 Expected date:  Expected time:  Means of arrival:  Comments: EMS 35 yo M, flank pain

## 2016-10-02 NOTE — ED Notes (Signed)
Pt ambulatory and independent at discharge.  Verbalized understanding of discharge instructions 

## 2016-10-02 NOTE — Discharge Instructions (Signed)
Drink plenty of fluids. Follow-up with Dr.Dahlstedt or one of his partners at [facility] next week

## 2016-10-02 NOTE — ED Notes (Signed)
Assisted in I&O to obtain urine.  Informed MD.  Sent urine to lab.

## 2016-10-02 NOTE — ED Notes (Signed)
Pt cannot use restroom at this time, aware urine specimen is needed.  

## 2016-10-03 NOTE — ED Provider Notes (Signed)
[facility]Y DEPT Provider Note   CSN: [id number] Arrival date & time: 10/02/16  [id number]     History   Chief Complaint Chief Complaint  Patient presents with  . Flank Pain    HPI Justin [name] is a 35 y.o. male.  Patient complains of pain in the right flank area.   The history is provided by the patient. No language interpreter was used.  Flank Pain  This is a new problem. The current episode started 3 to 5 hours ago. The problem occurs constantly. The problem has not changed since onset.Pertinent negatives include no chest pain, no abdominal pain and no headaches. Nothing aggravates the symptoms. Nothing relieves the symptoms. He has tried nothing for the symptoms. The treatment provided no relief.    Past Medical History:  Diagnosis Date  . Anxiety   . Depression   . GERD (gastroesophageal reflux disease)     Patient Active Problem List   Diagnosis Date Noted  . GERD (gastroesophageal reflux disease) 05/25/2014  . Generalized anxiety disorder 05/25/2014  . Depression 01/17/2014  . NICOTINE ADDICTION 01/08/2010    History reviewed. No pertinent surgical history.     Home Medications    Prior to Admission medications   Medication Sig Start Date End Date Taking? Authorizing Provider  ALPRAZolam Prudy Feeler(XANAX) 1 MG tablet Take 1 tablet (1 mg total) by mouth every 6 (six) hours as needed for anxiety. 04/02/16  Yes Nelwyn SalisburyStephen A Fry, MD  buPROPion (WELLBUTRIN XL) 150 MG 24 hr tablet Take 1 tablet (150 mg total) by mouth daily. 10/31/15  Yes Nelwyn SalisburyStephen A Fry, MD  lamoTRIgine (LAMICTAL) 100 MG tablet Take 1 tablet (100 mg total) by mouth daily. Patient taking differently: Take 100 mg by mouth at bedtime.  04/01/16  Yes Nelwyn SalisburyStephen A Fry, MD  ranitidine (ZANTAC) 150 MG tablet Take 2 tablets (300 mg total) by mouth 2 (two) times daily. 11/16/14  Yes Nelwyn SalisburyStephen A Fry, MD  ondansetron (ZOFRAN ODT) 4 MG disintegrating tablet 4mg  ODT q4 hours prn nausea/vomit 10/02/16   Bethann BerkshireJoseph Verdie Barrows, MD    oxyCODONE-acetaminophen (PERCOCET) 5-325 MG tablet Take 1 tablet by mouth every 6 (six) hours as needed. 10/02/16   Bethann BerkshireJoseph Yaritza Leist, MD  tamsulosin (FLOMAX) 0.4 MG CAPS capsule Take 1 capsule (0.4 mg total) by mouth daily. 10/02/16   Bethann BerkshireJoseph Martice Doty, MD    Family History Family History  Problem Relation Age of Onset  . Diabetes Father   . Heart disease Father   . Hypertension Father     Social History Social History  Substance Use Topics  . Smoking status: Current Every Day Smoker    Packs/day: 0.50    Types: Cigarettes  . Smokeless tobacco: Never Used     Comment: or less  . Alcohol use No     Allergies   Bee venom   Review of Systems Review of Systems  Constitutional: Negative for appetite change and fatigue.  HENT: Negative for congestion, ear discharge and sinus pressure.   Eyes: Negative for discharge.  Respiratory: Negative for cough.   Cardiovascular: Negative for chest pain.  Gastrointestinal: Negative for abdominal pain and diarrhea.  Genitourinary: Positive for flank pain. Negative for frequency and hematuria.  Musculoskeletal: Negative for back pain.  Skin: Negative for rash.  Neurological: Negative for seizures and headaches.  Psychiatric/Behavioral: Negative for hallucinations.     Physical Exam Updated Vital Signs BP 109/68   Pulse 60   Temp 97.6 F (36.4 C) (Oral)   Resp 18   Ht  6\' 1"  (1.854 m)   Wt 235 lb (106.6 kg)   SpO2 96%   BMI 31.00 kg/m   Physical Exam  Constitutional: He is oriented to person, place, and time. He appears well-developed.  HENT:  Head: Normocephalic.  Eyes: Conjunctivae are normal.  Neck: No tracheal deviation present.  Cardiovascular:  No murmur heard. Genitourinary:  Genitourinary Comments: Tender right flank  Musculoskeletal: Normal range of motion.  Neurological: He is oriented to person, place, and time.  Skin: Skin is warm.  Psychiatric: He has a normal mood and affect.     ED Treatments / Results   Labs (all labs ordered are listed, but only abnormal results are displayed) Labs Reviewed  URINALYSIS, ROUTINE W REFLEX MICROSCOPIC (NOT AT [facility]) - Abnormal; Notable for the following:       Result Value   Color, Urine AMBER (*)    APPearance CLOUDY (*)    Hgb urine dipstick LARGE (*)    Protein, ur 30 (*)    All other components within normal limits  CBC WITH DIFFERENTIAL/PLATELET - Abnormal; Notable for the following:    WBC 11.1 (*)    Neutro Abs 9.0 (*)    All other components within normal limits  COMPREHENSIVE METABOLIC PANEL - Abnormal; Notable for the following:    Glucose, Bld 133 (*)    All other components within normal limits  URINE MICROSCOPIC-ADD ON    EKG  EKG Interpretation None       Radiology Ct Renal Stone Study  Result Date: 10/02/2016 CLINICAL DATA:  Right flank pain radiating to the right lower quadrant EXAM: CT ABDOMEN AND PELVIS WITHOUT CONTRAST TECHNIQUE: Multidetector CT imaging of the abdomen and pelvis was performed following the standard protocol without IV contrast. COMPARISON:  None. FINDINGS: Lower chest: The lung bases are clear although somewhat obscured by motion. Hepatobiliary: The liver is unremarkable in the unenhanced state. No calcified gallstones are seen. Pancreas: The pancreas is normal in size and the pancreatic duct is not dilated. Spleen: The spleen is unremarkable. Adrenals/Urinary Tract: The adrenal glands appear symmetrical and normal. No renal calculi are seen. There is perhaps minimal fullness of the right pelvocaliceal system due to low-grade obstruction caused by a 4 mm proximal right ureteral calculus. The left ureter is normal in caliber. The urinary bladder is moderately well distended with no abnormality noted. Stomach/Bowel: The stomach is moderately distended with fluid with no abnormality evident. No small bowel distention is seen. The colon is largely decompressed. The terminal ileum and the appendix are unremarkable.  Vascular/Lymphatic: The abdominal aorta is normal in caliber. No adenopathy is seen. Reproductive: The prostate gland is normal in size. No fluid is seen within the pelvis. Other: None Musculoskeletal: The lumbar vertebrae are in normal alignment. There are pars defects bilaterally at L5 but normal alignment is maintained. Intervertebral disc spaces appear normal. IMPRESSION: 1. Low-grade right hydronephrosis caused by a 4 mm proximal right ureteral calculus. No additional renal or ureteral calculi are seen. 2. The terminal ileum and the appendix are unremarkable. 3. Bilateral pars defects at L5.  Normal alignment. Electronically Signed   By: Dwyane Dee M.D.   On: 10/02/2016 10:52    Procedures Procedures (including critical care time)  Medications Ordered in ED Medications  ketorolac (TORADOL) 30 MG/ML injection 30 mg (30 mg Intravenous Given 10/02/16 1045)  ondansetron (ZOFRAN) injection 4 mg (4 mg Intravenous Given 10/02/16 1045)  HYDROmorphone (DILAUDID) injection 0.5 mg (0.5 mg Intravenous Given 10/02/16 1045)  HYDROmorphone (  DILAUDID) injection 1 mg (1 mg Intravenous Given 10/02/16 1353)  ondansetron (ZOFRAN) injection 4 mg (4 mg Intravenous Given 10/02/16 1353)  sodium chloride 0.9 % bolus 1,000 mL (0 mLs Intravenous Stopped 10/02/16 1503)     Initial Impression / Assessment and Plan / ED Course  I have reviewed the triage vital signs and the nursing notes.  Pertinent labs & imaging results that were available during my care of the patient were reviewed by me and considered in my medical decision making (see chart for details).  Clinical Course     Patient has a 4 mm kidney stone in the right. He will be sent home with pain medicine nausea medicine and Flomax and will follow-up with his urologist next week  Final Clinical Impressions(s) / ED Diagnoses   Final diagnoses:  Kidney stone    New Prescriptions Discharge Medication List as of 10/02/2016  3:57 PM    START taking  these medications   Details  ondansetron (ZOFRAN ODT) 4 MG disintegrating tablet 4mg  ODT q4 hours prn nausea/vomit, Print    oxyCODONE-acetaminophen (PERCOCET) 5-325 MG tablet Take 1 tablet by mouth every 6 (six) hours as needed., Starting Wed 10/02/2016, Print    tamsulosin (FLOMAX) 0.4 MG CAPS capsule Take 1 capsule (0.4 mg total) by mouth daily., Starting Wed 10/02/2016, Print         Bethann BerkshireJoseph Christen Wardrop, MD 10/03/16 [phone]

## 2016-10-15 ENCOUNTER — Telehealth: Payer: Self-pay | Admitting: Family Medicine

## 2016-10-15 NOTE — Telephone Encounter (Signed)
Call in Bupropion #30 with 5rf, Lamotrigene #30 with 5 rf, and Alprazolam #120 with 5 rf

## 2016-10-15 NOTE — Telephone Encounter (Signed)
Refill request for Lamotrigine, Bupropion XL, Alprazolam and send to [facility].

## 2016-10-17 MED ORDER — BUPROPION HCL ER (XL) 150 MG PO TB24: 150.0000 mg | ORAL_TABLET | Freq: Every day | ORAL | 5 refills | Status: DC

## 2016-10-17 MED ORDER — LAMOTRIGINE 100 MG PO TABS: 100.0000 mg | ORAL_TABLET | Freq: Every day | ORAL | 5 refills | Status: DC

## 2016-10-17 MED ORDER — ALPRAZOLAM 1 MG PO TABS: 1.0000 mg | ORAL_TABLET | Freq: Four times a day (QID) | ORAL | 5 refills | Status: DC | PRN

## 2016-10-17 NOTE — Telephone Encounter (Signed)
All 3 scripts were sent to pharmacy.

## 2017-01-08 ENCOUNTER — Telehealth: Payer: Self-pay | Admitting: Family Medicine

## 2017-01-08 NOTE — Telephone Encounter (Signed)
Wife calling stating that pt would like to transfer care from Dr Clent RidgesFry to Malva Coganody Martin, Please advise ok to schedule.   Call wife back for scheduling at [phone]

## 2017-01-08 NOTE — Telephone Encounter (Signed)
Called pt and LMOVM to return call.  °

## 2017-01-08 NOTE — Telephone Encounter (Signed)
That is fine with me, thanks

## 2017-01-08 NOTE — Telephone Encounter (Signed)
Ok with me 

## 2017-03-27 ENCOUNTER — Telehealth: Payer: Self-pay | Admitting: Family Medicine

## 2017-03-27 NOTE — Telephone Encounter (Signed)
Okay with me also 

## 2017-03-27 NOTE — Telephone Encounter (Signed)
Pt requesting to change pcp from Dr. Clent RidgesFry to Malva Coganody Martin, [place] due to [facility] being closer to patient's home.

## 2017-03-27 NOTE — Telephone Encounter (Signed)
Ok with me 

## 2017-03-28 ENCOUNTER — Ambulatory Visit (INDEPENDENT_AMBULATORY_CARE_PROVIDER_SITE_OTHER): Payer: BLUE CROSS/BLUE SHIELD | Admitting: Physician Assistant

## 2017-03-28 ENCOUNTER — Encounter: Payer: Self-pay | Admitting: Physician Assistant

## 2017-03-28 VITALS — BP 130/90 | HR 93 | Temp 98.6°F | Resp 16 | Ht 73.0 in | Wt 256.0 lb

## 2017-03-28 DIAGNOSIS — M5442 Lumbago with sciatica, left side: Secondary | ICD-10-CM | POA: Diagnosis not present

## 2017-03-28 DIAGNOSIS — B9789 Other viral agents as the cause of diseases classified elsewhere: Secondary | ICD-10-CM

## 2017-03-28 DIAGNOSIS — J329 Chronic sinusitis, unspecified: Secondary | ICD-10-CM

## 2017-03-28 MED ORDER — FLUTICASONE PROPIONATE 50 MCG/ACT NA SUSP: 2.0000 | Freq: Every day | NASAL | 6 refills | Status: DC

## 2017-03-28 MED ORDER — CYCLOBENZAPRINE HCL 10 MG PO TABS: 10.0000 mg | ORAL_TABLET | Freq: Three times a day (TID) | ORAL | 0 refills | Status: DC | PRN

## 2017-03-28 MED ORDER — MELOXICAM 15 MG PO TABS: 15.0000 mg | ORAL_TABLET | Freq: Every day | ORAL | 0 refills | Status: DC

## 2017-03-28 MED ORDER — CYCLOBENZAPRINE HCL 10 MG PO TABS: 10.0000 mg | ORAL_TABLET | Freq: Every day | ORAL | 0 refills | Status: DC

## 2017-03-28 NOTE — Patient Instructions (Signed)
We are sorry that you are not feeling well.  Here is how we plan to help!  Based on what you have shared with me it looks like you have sinusitis.  Sinusitis is inflammation and infection in the sinus cavities of the head.  Based on your presentation I believe you most likely have Acute Viral Sinusitis.This is an infection most likely caused by a virus. There is not specific treatment for viral sinusitis other than to help you with the symptoms until the infection runs its course.  You may use an oral decongestant such as Mucinex D or if you have glaucoma or high blood pressure use plain Mucinex. Saline nasal spray help and can safely be used as often as needed for congestion, I have prescribed: Fluticasone nasal spray two sprays in each nostril twice a day  Some authorities believe that zinc sprays or the use of Echinacea may shorten the course of your symptoms.  Sinus infections are not as easily transmitted as other respiratory infection, however we still recommend that you avoid close contact with loved ones, especially the very young and elderly.  Remember to wash your hands thoroughly throughout the day as this is the number one way to prevent the spread of infection!   For Back -- take the Meloxicam once daily with food. Tylenol if needed for breakthrough pain. Take the Flexeril at nighttime. Avoid heavy lifting and overexertion. Use a heating pad 3 times a day, for 10-15 minutes each.  Follow-up in 1 week for assessment of chronic conditions.        Home Care:  Only take medications as instructed by your medical team.  Complete the entire course of an antibiotic.  Do not take these medications with alcohol.  A steam or ultrasonic humidifier can help congestion.  You can place a towel over your head and breathe in the steam from hot water coming from a faucet.  Avoid close contacts especially the very young and the elderly.  Cover your mouth when you cough or sneeze.  Always  remember to wash your hands.  Get Help Right Away If:  You develop worsening fever or sinus pain.  You develop a severe head ache or visual changes.  Your symptoms persist after you have completed your treatment plan.  Make sure you  Understand these instructions.  Will watch your condition.  Will get help right away if you are not doing well or get worse.

## 2017-03-28 NOTE — Progress Notes (Signed)
Pre visit review using our clinic review tool, if applicable. No additional management support is needed unless otherwise documented below in the visit note. 

## 2017-03-28 NOTE — Progress Notes (Signed)
Patient presents to clinic today c/o 3-4 days of nasal congestion with sinus pressure. Endorses headaches, chills, ear pressure. Denies fever, chest pain. Notes fatigue. Wife and children with similar symptoms. Patient has been very busy and has not been able to rest much. Is taking Dayquil with some relief of symptoms.  Patent also endorses 3 weeks of left-sided low back pain with occasional radiation into the lower extremity. Denies trauma or injury but is very active at work. Has not been able to rest. Denies numbness/tingling/weakness of extremities. Denies saddle anesthesia. Has not taken anything for pain.   Past Medical History:  Diagnosis Date  . Anxiety   . Depression   . GERD (gastroesophageal reflux disease)     Current Outpatient Prescriptions on File Prior to Visit  Medication Sig Dispense Refill  . ALPRAZolam (XANAX) 1 MG tablet Take 1 tablet (1 mg total) by mouth every 6 (six) hours as needed for anxiety. 120 tablet 5  . buPROPion (WELLBUTRIN XL) 150 MG 24 hr tablet Take 1 tablet (150 mg total) by mouth daily. 30 tablet 5  . lamoTRIgine (LAMICTAL) 100 MG tablet Take 1 tablet (100 mg total) by mouth at bedtime. 30 tablet 5  . ranitidine (ZANTAC) 150 MG tablet Take 2 tablets (300 mg total) by mouth 2 (two) times daily. (Patient taking differently: Take 300 mg by mouth at bedtime. ) 60 tablet 0   No current facility-administered medications on file prior to visit.     Allergies  Allergen Reactions  . Bee Venom Other (See Comments)    Localized swelling    Family History  Problem Relation Age of Onset  . Diabetes Father   . Heart disease Father   . Hypertension Father     Social History   Social History  . Marital status: Married    Spouse name: N/A  . Number of children: N/A  . Years of education: N/A   Social History Main Topics  . Smoking status: Current Every Day Smoker    Packs/day: 0.50    Types: Cigarettes  . Smokeless tobacco: Never Used  .  Alcohol use No  . Drug use: No  . Sexual activity: Not Asked   Other Topics Concern  . None   Social History Narrative  . None   Review of Systems - See HPI.  All other ROS are negative.  BP 130/90   Pulse 93   Temp 98.6 F (37 C) (Oral)   Resp 16   Ht 6\' 1"  (1.854 m)   Wt 256 lb (116.1 kg)   SpO2 98%   BMI 33.78 kg/m   Physical Exam  Constitutional: He is oriented to person, place, and time and well-developed, well-nourished, and in no distress.  HENT:  Head: Normocephalic and atraumatic.  Right Ear: External ear normal.  Left Ear: External ear normal.  Nose: Nose normal.  Mouth/Throat: Oropharynx is clear and moist. No oropharyngeal exudate.  Scant serous fluid noted behind TM bilaterally.  Eyes: Conjunctivae are normal.  Neck: Neck supple.  Cardiovascular: Normal rate, regular rhythm, normal heart sounds and intact distal pulses.   Pulmonary/Chest: Effort normal and breath sounds normal. No respiratory distress. He has no wheezes. He has no rales. He exhibits no tenderness.  Musculoskeletal:       Cervical back: Normal.       Thoracic back: Normal.       Lumbar back: He exhibits tenderness, pain and spasm. He exhibits normal range of motion and no bony  tenderness.  Moderate tenderness to Left lumbar perispinal musculature.   Lymphadenopathy:    He has no cervical adenopathy.  Neurological: He is alert and oriented to person, place, and time.  Skin: Skin is warm and dry. No rash noted.  Psychiatric: Affect normal.  Vitals reviewed.  Assessment/Plan: 1. Viral sinusitis Start Flonase. Increase fluids. Rest. Start Mucinex-D. Supportive measures reviewed. Follow-up if not resolving - fluticasone (FLONASE) 50 MCG/ACT nasal spray; Place 2 sprays into both nostrils daily.  Dispense: 16 g; Refill: 6  2. Acute left-sided low back pain with left-sided sciatica Start Mobic and Flexeril. Supportive measures reviewed. Heating pad use reviewed. Stretches reviewed.  -  meloxicam (MOBIC) 15 MG tablet; Take 1 tablet (15 mg total) by mouth daily.  Dispense: 30 tablet; Refill: 0 - cyclobenzaprine (FLEXERIL) 10 MG tablet; Take 1 tablet (10 mg total) by mouth 3 (three) times daily as needed for muscle spasms.  Dispense: 30 tablet; Refill: 0  Follow-up 1 week so we can discuss chronic medical issues as patient is transferring care.  Piedad Climes, PA-C

## 2017-03-31 ENCOUNTER — Encounter: Payer: Self-pay | Admitting: Physician Assistant

## 2017-03-31 ENCOUNTER — Ambulatory Visit: Payer: Self-pay | Admitting: Physician Assistant

## 2017-03-31 VITALS — BP 116/80 | HR 104 | Temp 98.5°F | Resp 16 | Ht 73.0 in | Wt 256.0 lb

## 2017-03-31 DIAGNOSIS — J208 Acute bronchitis due to other specified organisms: Secondary | ICD-10-CM | POA: Diagnosis not present

## 2017-03-31 DIAGNOSIS — B9689 Other specified bacterial agents as the cause of diseases classified elsewhere: Secondary | ICD-10-CM

## 2017-03-31 MED ORDER — AZITHROMYCIN 250 MG PO TABS: ORAL_TABLET | ORAL | 0 refills | Status: DC

## 2017-03-31 MED ORDER — ALPRAZOLAM 1 MG PO TABS: 1.0000 mg | ORAL_TABLET | Freq: Four times a day (QID) | ORAL | 0 refills | Status: DC | PRN

## 2017-03-31 MED ORDER — BENZONATATE 100 MG PO CAPS: 100.0000 mg | ORAL_CAPSULE | Freq: Two times a day (BID) | ORAL | 0 refills | Status: DC | PRN

## 2017-03-31 NOTE — Progress Notes (Signed)
Patient presents to clinic today c/o worsening chest congestion, now with productive cough. Still having sinus pressure but without sinus pain, ear pain or tooth pain. Denies fever. Notes resolution of chills.   Past Medical History:  Diagnosis Date  . Anxiety   . Depression   . GERD (gastroesophageal reflux disease)     Current Outpatient Prescriptions on File Prior to Visit  Medication Sig Dispense Refill  . ALPRAZolam (XANAX) 1 MG tablet Take 1 tablet (1 mg total) by mouth every 6 (six) hours as needed for anxiety. 120 tablet 5  . buPROPion (WELLBUTRIN XL) 150 MG 24 hr tablet Take 1 tablet (150 mg total) by mouth daily. 30 tablet 5  . cyclobenzaprine (FLEXERIL) 10 MG tablet Take 1 tablet (10 mg total) by mouth at bedtime. 15 tablet 0  . fluticasone (FLONASE) 50 MCG/ACT nasal spray Place 2 sprays into both nostrils daily. 16 g 6  . lamoTRIgine (LAMICTAL) 100 MG tablet Take 1 tablet (100 mg total) by mouth at bedtime. 30 tablet 5  . meloxicam (MOBIC) 15 MG tablet Take 1 tablet (15 mg total) by mouth daily. 30 tablet 0  . ranitidine (ZANTAC) 150 MG tablet Take 2 tablets (300 mg total) by mouth 2 (two) times daily. (Patient taking differently: Take 300 mg by mouth at bedtime. ) 60 tablet 0   No current facility-administered medications on file prior to visit.     Allergies  Allergen Reactions  . Bee Venom Other (See Comments)    Localized swelling    Family History  Problem Relation Age of Onset  . Diabetes Father   . Heart disease Father   . Hypertension Father     Social History   Social History  . Marital status: Married    Spouse name: N/A  . Number of children: N/A  . Years of education: N/A   Social History Main Topics  . Smoking status: Current Every Day Smoker    Packs/day: 0.50    Types: Cigarettes  . Smokeless tobacco: Never Used  . Alcohol use No  . Drug use: No  . Sexual activity: Not Asked   Other Topics Concern  . None   Social History Narrative   . None   Review of Systems - See HPI.  All other ROS are negative.  BP 116/80   Pulse (!) 104   Temp 98.5 F (36.9 C) (Oral)   Resp 16   Ht 6\' 1"  (1.854 m)   Wt 256 lb (116.1 kg)   SpO2 98%   BMI 33.78 kg/m   Physical Exam  Constitutional: He is oriented to person, place, and time and well-developed, well-nourished, and in no distress.  HENT:  Head: Normocephalic and atraumatic.  Right Ear: External ear normal.  Left Ear: External ear normal.  Nose: Nose normal.  Mouth/Throat: Oropharynx is clear and moist. No oropharyngeal exudate.  TM within normal limits.  Eyes: Conjunctivae are normal. Pupils are equal, round, and reactive to light.  Neck: Neck supple.  Cardiovascular: Normal rate, regular rhythm, normal heart sounds and intact distal pulses.   Pulmonary/Chest: Effort normal and breath sounds normal. No respiratory distress. He has no wheezes. He has no rales. He exhibits no tenderness.  Neurological: He is alert and oriented to person, place, and time.  Skin: Skin is warm and dry. No rash noted.  Psychiatric: Affect normal.  Vitals reviewed.  Assessment/Plan: 1. Acute bacterial bronchitis Start Z-pack. Tessalon per orders. Supportive measures and OTC medications reviewed. Follow-up  scheduled in 1-2 weeks to discuss chronic medical issues. - azithromycin (ZITHROMAX) 250 MG tablet; Take 2 tablets on Day 1. Then take 1 tablet daily.  Dispense: 6 tablet; Refill: 0   Piedad ClimesMartin, Boyce Keltner Cody, [place]

## 2017-03-31 NOTE — Patient Instructions (Signed)
Take antibiotic (Azithromycin) as directed.  Increase fluids.  Get plenty of rest. Use Mucinex for congestion. Use Tessalon as directed for cough. Take a daily probiotic (I recommend Align or Culturelle, but even Activia Yogurt may be beneficial).  A humidifier placed in the bedroom may offer some relief for a dry, scratchy throat of nasal irritation.  Read information below on acute bronchitis. Please call or return to clinic if symptoms are not improving.  Continue other medications as directed. Schedule a follow-up with me when you are back in town from work so we can discuss changes.   Acute Bronchitis Bronchitis is when the airways that extend from the windpipe into the lungs get red, puffy, and painful (inflamed). Bronchitis often causes thick spit (mucus) to develop. This leads to a cough. A cough is the most common symptom of bronchitis. In acute bronchitis, the condition usually begins suddenly and goes away over time (usually in 2 weeks). Smoking, allergies, and asthma can make bronchitis worse. Repeated episodes of bronchitis may cause more lung problems.  HOME CARE  Rest.  Drink enough fluids to keep your pee (urine) clear or pale yellow (unless you need to limit fluids as told by your doctor).  Only take over-the-counter or prescription medicines as told by your doctor.  Avoid smoking and secondhand smoke. These can make bronchitis worse. If you are a smoker, think about using nicotine gum or skin patches. Quitting smoking will help your lungs heal faster.  Reduce the chance of getting bronchitis again by:  Washing your hands often.  Avoiding people with cold symptoms.  Trying not to touch your hands to your mouth, nose, or eyes.  Follow up with your doctor as told.  GET HELP IF: Your symptoms do not improve after 1 week of treatment. Symptoms include:  Cough.  Fever.  Coughing up thick spit.  Body aches.  Chest congestion.  Chills.  Shortness of  breath.  Sore throat.  GET HELP RIGHT AWAY IF:   You have an increased fever.  You have chills.  You have severe shortness of breath.  You have bloody thick spit (sputum).  You throw up (vomit) often.  You lose too much body fluid (dehydration).  You have a severe headache.  You faint.  MAKE SURE YOU:   Understand these instructions.  Will watch your condition.  Will get help right away if you are not doing well or get worse. Document Released: 04/15/2008 Document Revised: 06/30/2013 Document Reviewed: 04/20/2013 [facility] Patient Information 2015 [employer], [place]. This information is not intended to replace advice given to you by your health care provider. Make sure you discuss any questions you have with your health care provider.

## 2017-04-01 ENCOUNTER — Ambulatory Visit: Payer: BLUE CROSS/BLUE SHIELD | Admitting: Physician Assistant

## 2017-04-04 ENCOUNTER — Ambulatory Visit: Payer: 59 | Admitting: Physician Assistant

## 2017-04-23 ENCOUNTER — Other Ambulatory Visit: Payer: Self-pay | Admitting: Physician Assistant

## 2017-04-23 NOTE — Telephone Encounter (Signed)
Pt needs refill on xanax, pt states that he has ran out, [facility]

## 2017-04-23 NOTE — Telephone Encounter (Signed)
Not due for a refill as it has been less than 30 days since last fill.

## 2017-04-23 NOTE — Telephone Encounter (Deleted)
Xanax last rx 03/31/17 #60  No CSC No UDS  Please advise

## 2017-04-24 ENCOUNTER — Other Ambulatory Visit: Payer: Self-pay | Admitting: Physician Assistant

## 2017-04-24 NOTE — Telephone Encounter (Signed)
Patient's wife called upset because they have not received a call back regarding this.  They are nervous about him having a panic attack.  When advised that Selena BattenCody stated it was not due yet, she became even more upset that they were not called and told this.  Now patient has to take a day off work and come in to clarify how/when he takes medication - all because they were not called and talked to about this.     I informed patient that message would be passed along, and I offered appointment for first thing on on 6/15 to discuss, which they accepted.

## 2017-04-24 NOTE — Telephone Encounter (Signed)
Patient was given partial fill of his Xanax (60 tablets -- previously given 120 tablets per month by prior PCP) at last visit for acute bronchitis as he needed fill before going out of town and wanted to discuss change in medicine on return. He was instructed to schedule follow-up appointment with me when he was back in town from work to further discuss medications and changes to them.   There is a 72 hour window for controlled medication refills. I am certainly sorry that they did not receive a call today with my instructions as I sent a response 04/23/17 after request was received yesterday at 4PM. I would also encourage them to request refills of this type of medication a few days in advance before instead of requesting after he is completely out of medication.
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
## 2017-04-25 ENCOUNTER — Ambulatory Visit (INDEPENDENT_AMBULATORY_CARE_PROVIDER_SITE_OTHER): Payer: BLUE CROSS/BLUE SHIELD | Admitting: Physician Assistant

## 2017-04-25 ENCOUNTER — Encounter: Payer: Self-pay | Admitting: Physician Assistant

## 2017-04-25 VITALS — BP 120/84 | HR 76 | Temp 98.0°F | Resp 14 | Ht 73.0 in | Wt 247.0 lb

## 2017-04-25 DIAGNOSIS — F411 Generalized anxiety disorder: Secondary | ICD-10-CM | POA: Diagnosis not present

## 2017-04-25 DIAGNOSIS — G8929 Other chronic pain: Secondary | ICD-10-CM

## 2017-04-25 DIAGNOSIS — M545 Low back pain: Secondary | ICD-10-CM | POA: Diagnosis not present

## 2017-04-25 MED ORDER — CELECOXIB 100 MG PO CAPS: 100.0000 mg | ORAL_CAPSULE | Freq: Two times a day (BID) | ORAL | 1 refills | Status: DC

## 2017-04-25 MED ORDER — SERTRALINE HCL 25 MG PO TABS: 25.0000 mg | ORAL_TABLET | Freq: Every day | ORAL | 5 refills | Status: DC

## 2017-04-25 MED ORDER — ALPRAZOLAM 1 MG PO TABS: 1.0000 mg | ORAL_TABLET | Freq: Four times a day (QID) | ORAL | 0 refills | Status: DC | PRN

## 2017-04-25 NOTE — Patient Instructions (Signed)
Stop the Wellbutrin. Start the Sertraline tomorrow. We may restart the Lamotrigine but want to see how you tolerate the Sertraline first. We will continue Xanax at previous dose but will work on decreasing the frequency of this medication as we get overall anxiety under control.  If you note any worsening symptoms on the new regimen, call or come see me ASAP.   Follow-up with me in 2-3 weeks.

## 2017-04-25 NOTE — Progress Notes (Signed)
Pre visit review using our clinic review tool, if applicable. No additional management support is needed unless otherwise documented below in the visit note. 

## 2017-04-26 DIAGNOSIS — G8929 Other chronic pain: Secondary | ICD-10-CM | POA: Insufficient documentation

## 2017-04-26 DIAGNOSIS — M545 Low back pain, unspecified: Secondary | ICD-10-CM | POA: Insufficient documentation

## 2017-04-26 NOTE — Assessment & Plan Note (Signed)
Patient is only taking his Lamictal once every few days -- discussed that the medication is not staying in his system to offer benefit, and there is risk of withdrawal symptoms of medication with how he is taking. Patient without depressed mood for quite some time. Mainly struggling with GAD with episodes of acute anxiety. Do not feel that Wellbutrin monotherapy is the bet option for him as it can exacerbate anxiety. Will switch from Wellbutrin to SSRI. Will start a comparable dose and quickly titrate medication. Will continue Xanax for now with plan to wean once anxiety is under better control. His long-term goal is to be on the least amount of medication as possible to control symptoms. Also discussed therapy. He will give some thought to this. Close follow-up scheduled.

## 2017-04-26 NOTE — Assessment & Plan Note (Signed)
Will obtain plain films to further assess. Will then consider PT versus need for more detailed imaging. Start trial of Celebrex. Supportive measures and stretching exercises reviewed.

## 2017-04-29 ENCOUNTER — Other Ambulatory Visit: Payer: Self-pay | Admitting: Physician Assistant

## 2017-05-22 ENCOUNTER — Ambulatory Visit (INDEPENDENT_AMBULATORY_CARE_PROVIDER_SITE_OTHER): Payer: BLUE CROSS/BLUE SHIELD

## 2017-05-22 DIAGNOSIS — M545 Low back pain: Secondary | ICD-10-CM

## 2017-05-22 DIAGNOSIS — G8929 Other chronic pain: Secondary | ICD-10-CM | POA: Diagnosis not present

## 2017-05-23 ENCOUNTER — Encounter: Payer: Self-pay | Admitting: Physician Assistant

## 2017-05-23 ENCOUNTER — Ambulatory Visit (INDEPENDENT_AMBULATORY_CARE_PROVIDER_SITE_OTHER): Payer: BLUE CROSS/BLUE SHIELD | Admitting: Physician Assistant

## 2017-05-23 VITALS — BP 120/90 | HR 67 | Temp 97.7°F | Resp 14 | Ht 73.0 in | Wt 242.0 lb

## 2017-05-23 DIAGNOSIS — G8929 Other chronic pain: Secondary | ICD-10-CM | POA: Diagnosis not present

## 2017-05-23 DIAGNOSIS — T63441A Toxic effect of venom of bees, accidental (unintentional), initial encounter: Secondary | ICD-10-CM

## 2017-05-23 DIAGNOSIS — F411 Generalized anxiety disorder: Secondary | ICD-10-CM | POA: Diagnosis not present

## 2017-05-23 DIAGNOSIS — M545 Low back pain: Secondary | ICD-10-CM | POA: Diagnosis not present

## 2017-05-23 MED ORDER — ALPRAZOLAM 1 MG PO TABS: 1.0000 mg | ORAL_TABLET | Freq: Two times a day (BID) | ORAL | 0 refills | Status: DC | PRN

## 2017-05-23 MED ORDER — DICLOFENAC SODIUM 75 MG PO TBEC: 75.0000 mg | DELAYED_RELEASE_TABLET | Freq: Two times a day (BID) | ORAL | 0 refills | Status: DC

## 2017-05-23 NOTE — Progress Notes (Signed)
Pre visit review using our clinic review tool, if applicable. No additional management support is needed unless otherwise documented below in the visit note. 

## 2017-05-23 NOTE — Assessment & Plan Note (Signed)
Needs MRI and neurosurgery assessment. Patient wants to discuss with wife. Will attempt trial of diclofenac. Discussed we are not a chronic pain management clinic -- needs Neurosurgery assessment for solutions to pain instead of placing a bandage on the issue. He voices understanding. Follow-up scheduled.

## 2017-05-23 NOTE — Progress Notes (Signed)
Patient presents to clinic today to discuss multiple issues.   Patient endorses bee sting occurring two days ago to L lateral ankle. Notes swelling and irritation to the area that has mostly resolved. Would like the area looked at. Denies fever, chills, malaise or fatigue.   Patient also following up regarding anxiety and PTSD. Is currently on Lamotrigine 100 mg nightly and Sertraline 25 mg daily. Is also on Xanax PRN -- has weaned down to TID from QID as prescribed by previous PCP.  Feels that he is doing well overall. He wants to continue weaning down on medication.  Patient also following up regarding chronic low back pain. Recent image revealed spondylolisthesis at L5-S1 and multiple levels of facet arthropathy. MRI recommended but patient yet to give approval for this. Was last prescribed Celebrex 100 mg BID but not noting any significant improvement.   Past Medical History:  Diagnosis Date  . Anxiety   . Depression   . GERD (gastroesophageal reflux disease)     Current Outpatient Prescriptions on File Prior to Visit  Medication Sig Dispense Refill  . fluticasone (FLONASE) 50 MCG/ACT nasal spray Place 2 sprays into both nostrils daily. 16 g 6  . lamoTRIgine (LAMICTAL) 100 MG tablet TAKE ONE TABLET BY MOUTH AT BEDTIME 30 tablet 5  . ranitidine (ZANTAC) 150 MG tablet Take 2 tablets (300 mg total) by mouth 2 (two) times daily. (Patient taking differently: Take 300 mg by mouth at bedtime. ) 60 tablet 0  . sertraline (ZOLOFT) 25 MG tablet Take 1 tablet (25 mg total) by mouth daily. 30 tablet 5   No current facility-administered medications on file prior to visit.     Allergies  Allergen Reactions  . Bee Venom Other (See Comments)    Localized swelling    Family History  Problem Relation Age of Onset  . Diabetes Father   . Heart disease Father   . Hypertension Father     Social History   Social History  . Marital status: Married    Spouse name: N/A  . Number of children:  N/A  . Years of education: N/A   Social History Main Topics  . Smoking status: Current Every Day Smoker    Packs/day: 0.50    Types: Cigarettes  . Smokeless tobacco: Never Used  . Alcohol use No  . Drug use: No  . Sexual activity: Yes   Other Topics Concern  . None   Social History Narrative  . None   Review of Systems - See HPI.  All other ROS are negative.  BP 120/90   Pulse 67   Temp 97.7 F (36.5 C) (Oral)   Resp 14   Ht 6\' 1"  (1.854 m)   Wt 242 lb (109.8 kg)   SpO2 98%   BMI 31.93 kg/m   Physical Exam  Constitutional: He is oriented to person, place, and time and well-developed, well-nourished, and in no distress.  HENT:  Head: Normocephalic and atraumatic.  Eyes: Conjunctivae are normal.  Neck: Neck supple.  Cardiovascular: Normal rate, regular rhythm, normal heart sounds and intact distal pulses.   Pulmonary/Chest: Effort normal and breath sounds normal. No respiratory distress. He has no wheezes. He has no rales. He exhibits no tenderness.  Lymphadenopathy:    He has no cervical adenopathy.  Neurological: He is alert and oriented to person, place, and time.  Skin: Skin is warm and dry. No rash noted.     Psychiatric: Affect normal.  Vitals reviewed.  Assessment/Plan:  Generalized anxiety disorder Doing very well presently. Has already successfully weaned xanax down to TID from QID. Will continue wean to BID. Continue Lamictal and Sertraline. Follow-up scheduled.   Chronic left-sided low back pain Needs MRI and neurosurgery assessment. Patient wants to discuss with wife. Will attempt trial of diclofenac. Discussed we are not a chronic pain management clinic -- needs Neurosurgery assessment for solutions to pain instead of placing a bandage on the issue. He voices understanding. Follow-up scheduled.  Bee sting No sign of allergic reaction. Supportive measures and OTC medications reviewed.     Piedad Climes, PA-C

## 2017-05-23 NOTE — Patient Instructions (Signed)
Please continue the Lamictal and Sertraline as directed. We will continue weaning on the Xanax. I am giving you a total of 90 for this month with hopes that you can further wean to just twice daily usage.  Stop the Celebrex. Start the diclofenac as directed with food. Let me know how this is working over the next week or so. We can add-on additional medication if needed. Please consider letting me get the MRI or sending you to Neurosurgery. We need a further assessment of this.   The bee sting does not look infected. I see no sign of a local reaction.  Can continue an antihistamine over the next day to help with any residual itch or puffiness.

## 2017-05-23 NOTE — Assessment & Plan Note (Signed)
Doing very well presently. Has already successfully weaned xanax down to TID from QID. Will continue wean to BID. Continue Lamictal and Sertraline. Follow-up scheduled.

## 2017-05-23 NOTE — Assessment & Plan Note (Signed)
No sign of allergic reaction. Supportive measures and OTC medications reviewed.

## 2017-05-26 ENCOUNTER — Telehealth: Payer: Self-pay | Admitting: Physician Assistant

## 2017-05-26 DIAGNOSIS — M4310 Spondylolisthesis, site unspecified: Secondary | ICD-10-CM

## 2017-05-26 NOTE — Telephone Encounter (Signed)
Stop the Diclofenac. We can consider a higher dose of the Celebrex like 200 mg BID, along with a small Rx for Hydrocodone for breakthrough severe pain only. Let me know what they decide.   Ok to place referral to Neurosurgery and MRI of lumbar spine.

## 2017-05-26 NOTE — Telephone Encounter (Signed)
Wife for pt asking for the referral to have an MRI or sending pt to Neurosurgery for further assessment of pt pain. Wife also states that the Rx that pt was given is upsetting his stomach and asking if there is anything else that could be called in for him, [facility]

## 2017-05-26 NOTE — Telephone Encounter (Signed)
Please advise about medication Diclofenac for back. Do you want me to place order for MRI and referral to Neurosurgery?

## 2017-05-27 ENCOUNTER — Other Ambulatory Visit: Payer: Self-pay | Admitting: Emergency Medicine

## 2017-05-27 MED ORDER — CELECOXIB 200 MG PO CAPS: 200.0000 mg | ORAL_CAPSULE | Freq: Two times a day (BID) | ORAL | 1 refills | Status: DC

## 2017-05-27 MED ORDER — OXYCODONE-ACETAMINOPHEN 5-325 MG PO TABS: 1.0000 | ORAL_TABLET | Freq: Three times a day (TID) | ORAL | 0 refills | Status: DC | PRN

## 2017-05-27 NOTE — Addendum Note (Signed)
Addended by: Waldon MerlMARTIN, Jasilyn Holderman C on: 05/27/2017 11:35 AM   Modules accepted: Orders

## 2017-05-27 NOTE — Addendum Note (Signed)
Addended by: Con MemosMOORE, Claretha Townshend S on: 05/27/2017 10:31 AM   Modules accepted: Orders

## 2017-05-27 NOTE — Telephone Encounter (Signed)
Rx printed and placed up front for pick up. 

## 2017-05-27 NOTE — Telephone Encounter (Signed)
LMOVM for recommendations

## 2017-05-27 NOTE — Telephone Encounter (Addendum)
Advised patient of stopping the Diclofenac and start Celebrex 200 mg bid. He is agreeable. He states the Hydrocodone causes upset stomach. He can tolerate a low dose of Oxycodone. Referral to Neurosurgery and MRI lumbar placed. Please advise

## 2017-05-30 ENCOUNTER — Telehealth: Payer: Self-pay | Admitting: Physician Assistant

## 2017-05-30 ENCOUNTER — Ambulatory Visit: Payer: BLUE CROSS/BLUE SHIELD | Admitting: Physician Assistant

## 2017-05-30 MED ORDER — OXYCODONE-ACETAMINOPHEN 5-325 MG PO TABS: 1.0000 | ORAL_TABLET | Freq: Three times a day (TID) | ORAL | 0 refills | Status: DC | PRN

## 2017-05-30 NOTE — Telephone Encounter (Signed)
Pt wife calling asking for refill on oxycodone, wife states that they are still waiting on an appt with Neuro and to have MRI done (waiting on per cert) wife states that yesterday pt car broke down and he had to push it out of road causing him to re injurer his back, wife states he is in a lot of pain and this will help him because he is still having to work.

## 2017-05-30 NOTE — Telephone Encounter (Signed)
Spoke with patient and advised a refill of the Oxycodone is ready at front desk for pick up. Patient states he has enough to last thru the weekend, will pick up on Monday.. Advised to take the medication sparingly. He is agreeable. If he hasn't scheduled the MRI or Neurosurgery before he runs out of medication then he would need to schedule a follow up appt with PCP. He is agreeable.

## 2017-05-30 NOTE — Telephone Encounter (Signed)
Last Oxycodone rx was 05/27/17 # 10  Please advise

## 2017-05-30 NOTE — Telephone Encounter (Signed)
Will allow one refill of 10 tablets. Can be filled tomorrow which is the earliest it can be filled. This is only to be used sparingly. Continue Celebrex as directed. Needs follow-up before further refills of oxycodone can be given as this was intended to be a rare-use medication.

## 2017-06-08 ENCOUNTER — Encounter (HOSPITAL_COMMUNITY): Payer: Self-pay | Admitting: Emergency Medicine

## 2017-06-08 ENCOUNTER — Emergency Department (HOSPITAL_COMMUNITY): Payer: BLUE CROSS/BLUE SHIELD

## 2017-06-08 ENCOUNTER — Emergency Department (HOSPITAL_COMMUNITY)
Admission: EM | Admit: 2017-06-08 | Discharge: 2017-06-08 | Disposition: A | Discharge status: Home / Self Care | Payer: BLUE CROSS/BLUE SHIELD | Attending: Emergency Medicine | Admitting: Emergency Medicine

## 2017-06-08 DIAGNOSIS — R072 Precordial pain: Secondary | ICD-10-CM | POA: Insufficient documentation

## 2017-06-08 DIAGNOSIS — T63444A Toxic effect of venom of bees, undetermined, initial encounter: Secondary | ICD-10-CM | POA: Diagnosis not present

## 2017-06-08 DIAGNOSIS — R42 Dizziness and giddiness: Secondary | ICD-10-CM

## 2017-06-08 DIAGNOSIS — F17210 Nicotine dependence, cigarettes, uncomplicated: Secondary | ICD-10-CM | POA: Insufficient documentation

## 2017-06-08 DIAGNOSIS — M5442 Lumbago with sciatica, left side: Secondary | ICD-10-CM

## 2017-06-08 LAB — COMPREHENSIVE METABOLIC PANEL
ALBUMIN: 3.8 g/dL (ref 3.5–5.0)
ALK PHOS: 60 U/L (ref 38–126)
ALT: 38 U/L (ref 17–63)
ANION GAP: 8 (ref 5–15)
AST: 30 U/L (ref 15–41)
BILIRUBIN TOTAL: 0.4 mg/dL (ref 0.3–1.2)
BUN: 12 mg/dL (ref 6–20)
CALCIUM: 9.2 mg/dL (ref 8.9–10.3)
CO2: 26 mmol/L (ref 22–32)
CREATININE: 0.77 mg/dL (ref 0.61–1.24)
Chloride: 105 mmol/L (ref 101–111)
GFR calc Af Amer: >60 mL/min (ref >60)
GFR calc non Af Amer: >60 mL/min (ref >60)
Glucose, Bld: 116 mg/dL — ABNORMAL HIGH (ref 65–99)
Potassium: 3.9 mmol/L (ref 3.5–5.1)
Sodium: 139 mmol/L (ref 135–145)
TOTAL PROTEIN: 5.9 g/dL — AB (ref 6.5–8.1)

## 2017-06-08 LAB — CBC WITH DIFFERENTIAL/PLATELET
BASOS ABS: 0 10*3/uL (ref 0.0–0.1)
BASOS PCT: 0 %
EOS ABS: 0.3 10*3/uL (ref 0.0–0.7)
Eosinophils Relative: 3 %
HEMATOCRIT: 43.7 % (ref 39.0–52.0)
HEMOGLOBIN: 14.7 g/dL (ref 13.0–17.0)
Lymphocytes Relative: 29 %
Lymphs Abs: 2.4 10*3/uL (ref 0.7–4.0)
MCH: 29.1 pg (ref 26.0–34.0)
MCHC: 33.6 g/dL (ref 30.0–36.0)
MCV: 86.5 fL (ref 78.0–100.0)
Monocytes Absolute: 0.6 10*3/uL (ref 0.1–1.0)
Monocytes Relative: 7 %
NEUTROS ABS: 4.9 10*3/uL (ref 1.7–7.7)
Neutrophils Relative %: 61 %
Platelets: 201 10*3/uL (ref 150–400)
RBC: 5.05 MIL/uL (ref 4.22–5.81)
RDW: 12.2 % (ref 11.5–15.5)
WBC: 8.2 10*3/uL (ref 4.0–10.5)

## 2017-06-08 LAB — I-STAT TROPONIN, ED: TROPONIN I, POC: 0 ng/mL (ref 0.00–0.08)

## 2017-06-08 MED ORDER — MORPHINE SULFATE (PF) 4 MG/ML IV SOLN
4.0000 mg | Freq: Once | INTRAVENOUS | Status: AC
  Administered 2017-06-08: 4 mg via INTRAVENOUS
  Filled 2017-06-08: qty 1

## 2017-06-08 MED ORDER — SODIUM CHLORIDE 0.9 % IV BOLUS (SEPSIS)
1000.0000 mL | Freq: Once | INTRAVENOUS | Status: AC
  Administered 2017-06-08: 1000 mL via INTRAVENOUS

## 2017-06-08 MED ORDER — EPINEPHRINE 0.3 MG/0.3ML IJ SOAJ: 0.3000 mg | Freq: Once | INTRAMUSCULAR | 0 refills | Status: AC

## 2017-06-08 MED ORDER — METHOCARBAMOL 500 MG PO TABS: 500.0000 mg | ORAL_TABLET | Freq: Two times a day (BID) | ORAL | 0 refills | Status: DC

## 2017-06-08 MED ORDER — OXYCODONE-ACETAMINOPHEN 5-325 MG PO TABS
1.0000 | ORAL_TABLET | Freq: Once | ORAL | Status: AC
  Administered 2017-06-08: 1 via ORAL
  Filled 2017-06-08: qty 1

## 2017-06-08 MED ORDER — DEXAMETHASONE SODIUM PHOSPHATE 10 MG/ML IJ SOLN
10.0000 mg | Freq: Once | INTRAMUSCULAR | Status: AC
  Administered 2017-06-08: 10 mg via INTRAVENOUS
  Filled 2017-06-08: qty 1

## 2017-06-08 NOTE — ED Notes (Signed)
Pt st;'s no relief from pain meds.  Pt remains alert and oriented x's 3.

## 2017-06-08 NOTE — ED Triage Notes (Signed)
Pt to ED via [facility] with c/o being stung on right forearm by a bee yesterday.  Pt has swollen area to his forearm.  Pt st's today he had sudden onset on lightheadedness and dizziness,.  Pt also has chronic lower back pain and c/o pain in back also

## 2017-06-08 NOTE — Discharge Instructions (Signed)

## 2017-06-08 NOTE — ED Provider Notes (Signed)
Emergency Department Provider Note   I have reviewed the triage vital signs and the nursing notes.   HISTORY  Chief Complaint Dizziness   HPI Justin [name] is a 36 y.o. male with PMH of chronic back pain with pars defect followed by Neurosurgery who presents to the emergency department for evaluation of multiple medical complaints including yellow jacket sting to the right forearm yesterday, lightheadedness and chest discomfort that started today, with continued lower back pain. Patient states he was stung in the arm yesterday with mild swelling. The area is painful and swelling has worsened throughout the day today. He notes some mild redness. Shortly after lunch time he developed generalized lightheadedness and chest tightness. No provoking factors. Pain radiates to the left shoulder. Slightly worse with movement. Patient also noted some continued lower back pain. He is followed by neurosurgery who is planning for spine injections and physical therapy. He reports outpatient plan to have MRI. He denies any injury today. States his back pain is generally getting worse but nothing acutely worse today. He has left sided sciatica that is unchanged. He denies any bowel or bladder incontinence. Denies any groin numbness. He has pain with ambulation but is able to walk.   Past Medical History:  Diagnosis Date  . Anxiety   . Depression   . GERD (gastroesophageal reflux disease)     Patient Active Problem List   Diagnosis Date Noted  . Bee sting 05/23/2017  . Chronic left-sided low back pain 04/26/2017  . GERD (gastroesophageal reflux disease) 05/25/2014  . Generalized anxiety disorder 05/25/2014  . Depression 01/17/2014  . NICOTINE ADDICTION 01/08/2010    History reviewed. No pertinent surgical history.  Current Outpatient Rx  . Order #: [id number] Class: Print  . Order #: [id number] Class: Normal  . Order #: [id number] Class: Normal  . Order #: [id number] Class: Print  . Order #:  [id number] Class: OTC  . Order #: [id number] Class: Normal  . Order #: [id number] Class: Print    Allergies Bee venom  Family History  Problem Relation Age of Onset  . Diabetes Father   . Heart disease Father   . Hypertension Father     Social History Social History  Substance Use Topics  . Smoking status: Current Every Day Smoker    Packs/day: 0.50    Types: Cigarettes  . Smokeless tobacco: Never Used  . Alcohol use No    Review of Systems  Constitutional: No fever/chills Eyes: No visual changes. ENT: No sore throat. Cardiovascular: Positive chest pain. Respiratory: Denies shortness of breath. Gastrointestinal: No abdominal pain.  No nausea, no vomiting.  No diarrhea.  No constipation. Genitourinary: Negative for dysuria. Musculoskeletal: Positive for back pain. Positive left arm swelling and redness after bee sting.  Skin: Negative for rash. Neurological: Negative for headaches, focal weakness or numbness.  10-point ROS otherwise negative.  ____________________________________________   PHYSICAL EXAM:  VITAL SIGNS: ED Triage Vitals  Enc Vitals Group     BP --      Pulse Rate 06/08/17 1944 (!) 57     Resp 06/08/17 1944 16     Temp 06/08/17 1944 97.6 F (36.4 C)     Temp Source 06/08/17 1944 Oral     SpO2 06/08/17 1944 99 %     Weight 06/08/17 2007 242 lb (109.8 kg)     Height 06/08/17 2007 6\' 1"  (1.854 m)     Pain Score 06/08/17 2006 8   Constitutional: Alert and oriented. Well appearing and in no acute distress. Eyes: Conjunctivae  are normal.  Head: Atraumatic. Nose: No congestion/rhinnorhea. Mouth/Throat: Mucous membranes are moist. Neck: No stridor. No cervical spine tenderness to palpation. Cardiovascular: Normal rate, regular rhythm. Good peripheral circulation. Grossly normal heart sounds.   Respiratory: Normal respiratory effort.  No retractions. Lungs CTAB. Gastrointestinal: Soft and nontender. No distention.  Musculoskeletal: No lower extremity  tenderness nor edema. No gross deformities of extremities. Positive diffuse lumbar spine tenderness to palpation both midline and paraspinal.  Neurologic:  Normal speech and language. No gross focal neurologic deficits are appreciated.  Skin:  Skin is warm, dry and intact. No rash noted.  ____________________________________________   LABS (all labs ordered are listed, but only abnormal results are displayed)  Labs Reviewed  COMPREHENSIVE METABOLIC PANEL - Abnormal; Notable for the following:       Result Value   Glucose, Bld 116 (*)    Total Protein 5.9 (*)    All other components within normal limits  CBC WITH DIFFERENTIAL/PLATELET  I-STAT TROPONIN, ED   ____________________________________________  EKG   EKG Interpretation  Date/Time:  Sunday June 08 2017 20:56:36 EDT Ventricular Rate:  55 PR Interval:    QRS Duration: 114 QT Interval:  411 QTC Calculation: 394 R Axis:   78 Text Interpretation:  Sinus rhythm No STEMI.  Confirmed by Alona BeneLong, Joshua [phone]([id number]) on 06/08/2017 9:12:47 PM       ____________________________________________  RADIOLOGY  Dg Chest 2 View  Result Date: 06/08/2017 CLINICAL DATA:  36 y/o M; chest pain, shortness of breath, arm swelling. EXAM: CHEST  2 VIEW COMPARISON:  04/28/2014 chest radiograph FINDINGS: Stable heart size and mediastinal contours are within normal limits. Both lungs are clear. The visualized skeletal structures are unremarkable. IMPRESSION: No active cardiopulmonary disease. Electronically Signed   By: Mitzi HansenLance  Furusawa-Stratton M.D.   On: 06/08/2017 22:32    ____________________________________________   PROCEDURES  Procedure(s) performed:   Procedures  None ____________________________________________   INITIAL IMPRESSION / ASSESSMENT AND PLAN / ED COURSE  Pertinent labs & imaging results that were available during my care of the patient reviewed by me and considered in my medical decision making (see chart for  details).  Patient presents to the emergency department for evaluation of right arm swelling after bee sting with associated chest pain and lightheadedness that occurred almost 16 hours after the sting. He is also complaining of lower back pain that is acute on chronic in nature. No signs or symptoms to suggest spinal cord emergency or require emergent MRI of the lumbar spine. He is following with neurosurgery as an outpatient to decide on timing of imaging and physical therapy versus lumbar spine injection. Patient with constant symptoms for the last 9 hours with single negative troponin and negative EKG. Low risk by HEART score. PERC negative for PE. Plan for decadron for arm swelling and muscle relaxer for back discomfort. Discharged with EpiPen.  At this time, I do not feel there is any life-threatening condition present. I have reviewed and discussed all results (EKG, imaging, lab, urine as appropriate), exam findings with patient. I have reviewed nursing notes and appropriate previous records.  I feel the patient is safe to be discharged home without further emergent workup. Discussed usual and customary return precautions. Patient and family (if present) verbalize understanding and are comfortable with this plan.  Patient will follow-up with their primary care provider. If they do not have a primary care provider, information for follow-up has been provided to them. All questions have been answered.  ____________________________________________  FINAL CLINICAL IMPRESSION(S) /  ED DIAGNOSES  Final diagnoses:  Lightheadedness  Bee sting, undetermined intent, initial encounter  Precordial chest pain  Midline low back pain with left-sided sciatica, unspecified chronicity     MEDICATIONS GIVEN DURING THIS VISIT:  Medications  sodium chloride 0.9 % bolus 1,000 mL (0 mLs Intravenous Stopped 06/08/17 2236)  morphine 4 MG/ML injection 4 mg (4 mg Intravenous Given 06/08/17 2127)  dexamethasone  (DECADRON) injection 10 mg (10 mg Intravenous Given 06/08/17 2328)  oxyCODONE-acetaminophen (PERCOCET/ROXICET) 5-325 MG per tablet 1 tablet (1 tablet Oral Given 06/08/17 2328)     NEW OUTPATIENT MEDICATIONS STARTED DURING THIS VISIT:  Discharge Medication List as of 06/08/2017 11:00 PM    START taking these medications   Details  EPINEPHrine (EPIPEN 2-PAK) 0.3 mg/0.3 mL IJ SOAJ injection Inject 0.3 mLs (0.3 mg total) into the muscle once., Starting Sun 06/08/2017, Print    methocarbamol (ROBAXIN) 500 MG tablet Take 1 tablet (500 mg total) by mouth 2 (two) times daily., Starting Sun 06/08/2017, Print        Note:  This document was prepared using Dragon voice recognition software and may include unintentional dictation errors.  Alona Bene, MD Emergency Medicine    Long, Arlyss Repress, MD 06/09/17 [phone]

## 2017-06-08 NOTE — ED Notes (Signed)
Pt to xray at this time.

## 2017-06-26 ENCOUNTER — Other Ambulatory Visit: Payer: Self-pay | Admitting: Physician Assistant

## 2017-06-26 NOTE — Telephone Encounter (Signed)
Requesting refill of ALPRAZolam (XANAX) 1 MG tablet.  Pharmacy:  [facility] [place] [address] [phone] (Phone) [phone] (Fax)

## 2017-06-26 NOTE — Telephone Encounter (Signed)
Xanax last rx 05/23/17 #90 CSC: 04/25/17 UDS: none Last OV: 05/23/17  Please advise

## 2017-06-27 MED ORDER — ALPRAZOLAM 1 MG PO TABS: 1.0000 mg | ORAL_TABLET | Freq: Three times a day (TID) | ORAL | 0 refills | Status: DC | PRN

## 2017-06-27 NOTE — Telephone Encounter (Signed)
Rx faxed to [facility], [place].

## 2017-07-22 ENCOUNTER — Other Ambulatory Visit: Payer: Self-pay | Admitting: Physician Assistant

## 2017-07-22 ENCOUNTER — Ambulatory Visit (INDEPENDENT_AMBULATORY_CARE_PROVIDER_SITE_OTHER): Payer: BLUE CROSS/BLUE SHIELD | Admitting: Physician Assistant

## 2017-07-22 ENCOUNTER — Encounter: Payer: Self-pay | Admitting: Physician Assistant

## 2017-07-22 VITALS — BP 124/98 | HR 69 | Temp 98.1°F | Resp 14 | Ht 73.0 in | Wt 255.0 lb

## 2017-07-22 DIAGNOSIS — F411 Generalized anxiety disorder: Secondary | ICD-10-CM | POA: Diagnosis not present

## 2017-07-22 DIAGNOSIS — K219 Gastro-esophageal reflux disease without esophagitis: Secondary | ICD-10-CM | POA: Diagnosis not present

## 2017-07-22 DIAGNOSIS — M545 Low back pain: Secondary | ICD-10-CM | POA: Diagnosis not present

## 2017-07-22 DIAGNOSIS — M25512 Pain in left shoulder: Secondary | ICD-10-CM | POA: Diagnosis not present

## 2017-07-22 DIAGNOSIS — G8929 Other chronic pain: Secondary | ICD-10-CM | POA: Diagnosis not present

## 2017-07-22 MED ORDER — PANTOPRAZOLE SODIUM 40 MG PO TBEC: 40.0000 mg | DELAYED_RELEASE_TABLET | Freq: Every day | ORAL | 3 refills | Status: DC

## 2017-07-22 NOTE — Progress Notes (Signed)
Pre visit review using our clinic review tool, if applicable. No additional management support is needed unless otherwise documented below in the visit note. 

## 2017-07-22 NOTE — Patient Instructions (Signed)
Please call Dr. Yetta Barre to discuss some things with him: (1) potential MRI (2) Switching Gabapentin for Lyrica.  Let me know how mood and sleep do with this change.  Please follow the instructions below for sleep hygiene.   We will continue Xanax as directed for now while you are working on smoking cessation to get your surgery scheduled.  Pain is the main trigger so we need to get this surgery.  Please start the Protonix as directed instead of the Zantac for reflux. Follow diet below. If not improving, will need assessment with Gastroenterology.  I am having you see Dr. Berline Chough for an ultrasound of your shoulder.  Sleep Hygiene  Do: (1) Go to bed at the same time each day. (2) Get up from bed at the same time each day. (3) Get regular exercise each day, preferably in the morning.  There is goof evidence that regular exercise improves restful sleep.  This includes stretching and aerobic exercise. (4) Get regular exposure to outdoor or bright lights, especially in the late afternoon. (5) Keep the temperature in your bedroom comfortable. (6) Keep the bedroom quiet when sleeping. (7) Keep the bedroom dark enough to facilitate sleep. (8) Use your bed only for sleep and sex. (9) Take medications as directed.  It is helpful to take prescribed sleeping pills 1 hour before bedtime, so they are causing drowsiness when you lie down, or 10 hours before getting up, to avoid daytime drowsiness. (10) Use a relaxation exercise just before going to sleep -- imagery, massage, warm bath. (11) Keep your feet and hands warm.  Wear warm socks and/or mittens or gloves to bed.  Don't: (1) Exercise just before going to bed. (2) Engage in stimulating activity just before bed, such as playing a competitive game, watching an exciting program on television, or having an important discussion with a loved one. (3) Have caffeine in the evening (coffee, teas, chocolate, sodas, etc.) (4) Read or watch television in  bed. (5) Use alcohol to help you sleep. (6) Go to bed too hungry or too full. (7) Take another person's sleeping pills. (8) Take over-the-counter sleeping pills, without your doctor's knowledge.  Tolerance can develop rapidly with these medications.  Diphenhydramine can have serious side effects for elderly patients. (9) Take daytime naps. (10) Command yourself to go to sleep.  This only makes your mind and body more alert.  If you lie awake for more than 20-30 minutes, get up, go to a different room, participate in a quiet activity (Ex - non-excitable reading or television), and then return to bed when you feel sleepy.  Do this as many times during the night as needed.  This may cause you to have a night or two of poor sleep but it will train your brain to know when it is time for sleep.   Food Choices for Gastroesophageal Reflux Disease, Adult When you have gastroesophageal reflux disease (GERD), the foods you eat and your eating habits are very important. Choosing the right foods can help ease your discomfort. What guidelines do I need to follow?  Choose fruits, vegetables, whole grains, and low-fat dairy products.  Choose low-fat meat, fish, and poultry.  Limit fats such as oils, salad dressings, butter, nuts, and avocado.  Keep a food diary. This helps you identify foods that cause symptoms.  Avoid foods that cause symptoms. These may be different for everyone.  Eat small meals often instead of 3 large meals a day.  Eat your meals slowly, in  a place where you are relaxed.  Limit fried foods.  Cook foods using methods other than frying.  Avoid drinking alcohol.  Avoid drinking large amounts of liquids with your meals.  Avoid bending over or lying down until 2-3 hours after eating. What foods are not recommended? These are some foods and drinks that may make your symptoms worse: Vegetables Tomatoes. Tomato juice. Tomato and spaghetti sauce. Chili peppers. Onion and garlic.  Horseradish. Fruits Oranges, grapefruit, and lemon (fruit and juice). Meats High-fat meats, fish, and poultry. This includes hot dogs, ribs, ham, sausage, salami, and bacon. Dairy Whole milk and chocolate milk. Sour cream. Cream. Butter. Ice cream. Cream cheese. Drinks Coffee and tea. Bubbly (carbonated) drinks or energy drinks. Condiments Hot sauce. Barbecue sauce. Sweets/Desserts Chocolate and cocoa. Donuts. Peppermint and spearmint. Fats and Oils High-fat foods. This includes JamaicaFrench fries and potato chips. Other Vinegar. Strong spices. This includes black pepper, white pepper, red pepper, cayenne, curry powder, cloves, ginger, and chili powder. The items listed above may not be a complete list of foods and drinks to avoid. Contact your dietitian for more information. This information is not intended to replace advice given to you by your health care provider. Make sure you discuss any questions you have with your health care provider. Document Released: 04/28/2012 Document Revised: 04/04/2016 Document Reviewed: 09/01/2013 Elsevier Interactive Patient Education  2017 [employer]

## 2017-07-22 NOTE — Progress Notes (Signed)
Patient presents to clinic today for follow-up of anxiety and depression. Patient also with acute concerns to discuss today.   Anxiety/Depression -- Patient is currently on a regimen of sertraline 25 mg, Lamictal 100 mg and Xanax 1 mg BID. We were weaning down on Xanax to BID from TID which he had been on for years. States he tried as well as he could but recent stressors and pain have made it hard to decrease to BID most days. Is working on smoking cessation so that he will be able to have his upcoming Neurosurgery. Is having issue with sleep at night, noting pain is interfering. Patient hs been put on Gabapentin recently but feels it is subtherapeutic.   New problems -- bump on left shoulder with associated pain x 2 weeks. Radiating down arm and up his neck. Denies trauma or injury. Denies weakness, numbness or tingling.   Patient also endorses some occasional difficulty swallowing with solids. Notes occasional heart burn and indigestion. Denies abdominal pain, nausea or vomiting.    Past Medical History:  Diagnosis Date  . Anxiety   . Depression   . GERD (gastroesophageal reflux disease)     Current Outpatient Prescriptions on File Prior to Visit  Medication Sig Dispense Refill  . celecoxib (CELEBREX) 200 MG capsule Take 1 capsule (200 mg total) by mouth 2 (two) times daily. 60 capsule 1  . lamoTRIgine (LAMICTAL) 100 MG tablet TAKE ONE TABLET BY MOUTH AT BEDTIME 30 tablet 5  . sertraline (ZOLOFT) 25 MG tablet Take 1 tablet (25 mg total) by mouth daily. 30 tablet 5   No current facility-administered medications on file prior to visit.     Allergies  Allergen Reactions  . Bee Venom Other (See Comments)    Localized swelling    Family History  Problem Relation Age of Onset  . Diabetes Father   . Heart disease Father   . Hypertension Father     Social History   Social History  . Marital status: Married    Spouse name: N/A  . Number of children: N/A  . Years of  education: N/A   Social History Main Topics  . Smoking status: Current Every Day Smoker    Packs/day: 0.50    Types: Cigarettes  . Smokeless tobacco: Never Used  . Alcohol use No  . Drug use: No  . Sexual activity: Yes   Other Topics Concern  . None   Social History Narrative  . None   Review of Systems - See HPI.  All other ROS are negative.  BP (!) 124/98   Pulse 69   Temp 98.1 F (36.7 C) (Oral)   Resp 14   Ht 6' 1"  (1.854 m)   Wt 255 lb (115.7 kg)   SpO2 95%   BMI 33.64 kg/m   Physical Exam  Constitutional: He is oriented to person, place, and time and well-developed, well-nourished, and in no distress.  HENT:  Head: Normocephalic and atraumatic.  Eyes: Conjunctivae are normal.  Neck: Neck supple.  Cardiovascular: Normal rate, regular rhythm, normal heart sounds and intact distal pulses.   Pulmonary/Chest: Effort normal and breath sounds normal. No respiratory distress. He has no wheezes. He has no rales. He exhibits no tenderness.  Musculoskeletal:       Left shoulder: He exhibits pain. He exhibits normal range of motion.  Neurological: He is alert and oriented to person, place, and time.  Skin: Skin is warm and dry. No rash noted.  Psychiatric: Affect  normal.  Vitals reviewed.  Recent Results (from the past 2160 hour(s))  Comprehensive metabolic panel     Status: Abnormal   Collection Time: 06/08/17  9:01 PM  Result Value Ref Range   Sodium 139 135 - 145 mmol/L   Potassium 3.9 3.5 - 5.1 mmol/L   Chloride 105 101 - 111 mmol/L   CO2 26 22 - 32 mmol/L   Glucose, Bld 116 (H) 65 - 99 mg/dL   BUN 12 6 - 20 mg/dL   Creatinine, Ser 0.77 0.61 - 1.24 mg/dL   Calcium 9.2 8.9 - 10.3 mg/dL   Total Protein 5.9 (L) 6.5 - 8.1 g/dL   Albumin 3.8 3.5 - 5.0 g/dL   AST 30 15 - 41 U/L   ALT 38 17 - 63 U/L   Alkaline Phosphatase 60 38 - 126 U/L   Total Bilirubin 0.4 0.3 - 1.2 mg/dL   GFR calc non Af Amer >60 >60 mL/min   GFR calc Af Amer >60 >60 mL/min    Comment:  (NOTE) The eGFR has been calculated using the CKD EPI equation. This calculation has not been validated in all clinical situations. eGFR's persistently <60 mL/min signify possible Chronic Kidney Disease.    Anion gap 8 5 - 15  CBC with Differential     Status: None   Collection Time: 06/08/17  9:01 PM  Result Value Ref Range   WBC 8.2 4.0 - 10.5 K/uL   RBC 5.05 4.22 - 5.81 MIL/uL   Hemoglobin 14.7 13.0 - 17.0 g/dL   HCT 43.7 39.0 - 52.0 %   MCV 86.5 78.0 - 100.0 fL   MCH 29.1 26.0 - 34.0 pg   MCHC 33.6 30.0 - 36.0 g/dL   RDW 12.2 11.5 - 15.5 %   Platelets 201 150 - 400 K/uL   Neutrophils Relative % 61 %   Neutro Abs 4.9 1.7 - 7.7 K/uL   Lymphocytes Relative 29 %   Lymphs Abs 2.4 0.7 - 4.0 K/uL   Monocytes Relative 7 %   Monocytes Absolute 0.6 0.1 - 1.0 K/uL   Eosinophils Relative 3 %   Eosinophils Absolute 0.3 0.0 - 0.7 K/uL   Basophils Relative 0 %   Basophils Absolute 0.0 0.0 - 0.1 K/uL  I-stat troponin, ED     Status: None   Collection Time: 06/08/17  9:16 PM  Result Value Ref Range   Troponin i, poc 0.00 0.00 - 0.08 ng/mL   Comment 3            Comment: Due to the release kinetics of cTnI, a negative result within the first hours of the onset of symptoms does not rule out myocardial infarction with certainty. If myocardial infarction is still suspected, repeat the test at appropriate intervals.     Assessment/Plan: GERD (gastroesophageal reflux disease) Stop Zantac. Start Protonix. GERD diet reviewed. If not improving will need assessment by GI and EGD.  Generalized anxiety disorder Discussed we will continue Xanax TID for now while he is trying to quit smoking. Want him to discuss change to Lyrica from Gabapentin with his specialist to help with back pain and mood. He will let me know how things are going.   Chronic left-sided low back pain Patient to follow-up with his Neurosurgeon for changes to his regimen.   Acute pain of left shoulder Will set up with  Sports Medicine for [facility] [place] to further assess. Supportive measures and OTC medications reviewed.    Leeanne Rio, PA-C

## 2017-07-30 DIAGNOSIS — M25512 Pain in left shoulder: Secondary | ICD-10-CM | POA: Insufficient documentation

## 2017-07-30 NOTE — Assessment & Plan Note (Signed)
Will set up with Sports Medicine for [facility] [place] to further assess. Supportive measures and OTC medications reviewed.

## 2017-07-30 NOTE — Assessment & Plan Note (Signed)
Patient to follow-up with his Neurosurgeon for changes to his regimen.

## 2017-07-30 NOTE — Assessment & Plan Note (Signed)
Discussed we will continue Xanax TID for now while he is trying to quit smoking. Want him to discuss change to Lyrica from Gabapentin with his specialist to help with back pain and mood. He will let me know how things are going.

## 2017-07-30 NOTE — Assessment & Plan Note (Signed)
Stop Zantac. Start Protonix. GERD diet reviewed. If not improving will need assessment by GI and EGD.

## 2017-08-07 ENCOUNTER — Other Ambulatory Visit: Payer: Self-pay | Admitting: Physician Assistant

## 2017-08-12 ENCOUNTER — Ambulatory Visit (INDEPENDENT_AMBULATORY_CARE_PROVIDER_SITE_OTHER): Payer: BLUE CROSS/BLUE SHIELD | Admitting: Sports Medicine

## 2017-08-12 ENCOUNTER — Ambulatory Visit (INDEPENDENT_AMBULATORY_CARE_PROVIDER_SITE_OTHER): Payer: BLUE CROSS/BLUE SHIELD

## 2017-08-12 ENCOUNTER — Encounter: Payer: Self-pay | Admitting: Sports Medicine

## 2017-08-12 ENCOUNTER — Ambulatory Visit: Payer: Self-pay

## 2017-08-12 VITALS — BP 112/80 | HR 74 | Ht 73.0 in | Wt 249.6 lb

## 2017-08-12 DIAGNOSIS — M19012 Primary osteoarthritis, left shoulder: Secondary | ICD-10-CM | POA: Diagnosis not present

## 2017-08-12 DIAGNOSIS — M25512 Pain in left shoulder: Secondary | ICD-10-CM | POA: Diagnosis not present

## 2017-08-12 NOTE — Patient Instructions (Addendum)
You had an injection today.  Things to be aware of after injection are listed below: . You may experience no significant improvement or even a slight worsening in your symptoms during the first 24 to 48 hours.  After that we expect your symptoms to improve gradually over the next 2 weeks for the medicine to have its maximal effect.  You should continue to have improvement out to 6 weeks after your injection. . Dr. Rigby recommends icing the site of the injection for 20 minutes  1-2 times the day of your injection . You may shower but no swimming, tub bath or Jacuzzi for 24 hours. . If your bandage falls off this does not need to be replaced.  It is appropriate to remove the bandage after 4 hours. . You may resume light activities as tolerated unless otherwise directed per Dr. Rigby during your visit  POSSIBLE STEROID SIDE EFFECTS:  Side effects from injectable steroids tend to be less than when taken orally however you may experience some of the symptoms listed below.  If experienced these should only last for a short period of time. Change in menstrual flow  Edema (swelling)  Increased appetite Skin flushing (redness)  Skin rash/acne  Thrush (oral) Yeast vaginitis    Increased sweating  Depression Increased blood glucose levels Cramping and leg/calf  Euphoria (feeling happy)  POSSIBLE PROCEDURE SIDE EFFECTS: The side effects of the injection are usually fairly minimal however if you may experience some of the following side effects that are usually self-limited and will is off on their own.  If you are concerned please feel free to call the office with questions:  Increased numbness or tingling  Nausea or vomiting  Swelling or bruising at the injection site   Please call our office if if you experience any of the following symptoms over the next week as these can be signs of infection:   Fever greater than 100.5F  Significant swelling at the injection site  Significant redness or drainage  from the injection site  If after 2 weeks you are continuing to have worsening symptoms please call our office to discuss what the next appropriate actions should be including the potential for a return office visit or other diagnostic testing.    Also check out "Foundation Training" which is a program developed by Dr. Eric Goodman.   There are links to a couple of his YouTube Videos below and I would like to see performing one of his videos 5-6 days per week.    A good intro video is: "Independence from Pain 7-minute Video" - https://www.youtube.com/watch?v=[email or web address]   His more advanced video is: "Powerful Posture and Pain Relief: 12 minutes of Foundation Training" - https://youtu.be/4BOTvaRaDjI  Do not try to attempt this entire video when first beginning.    Try breaking of each exercise that he goes into shorter segments.  Otherwise if they perform an exercise for 45 seconds, start with 15 seconds and rest and then resume when they begin the new activity.    If you work your way up to doing this 12 minute video, I expect you will see significant improvements in your pain.  If you enjoy his videos and would like to find out more you can look on his website: [email or web address].  He has a workout streaming option as well as a DVD set available for purchase.  Amazon has the best price for his DVDs.      

## 2017-08-12 NOTE — Progress Notes (Signed)
OFFICE VISIT NOTE [facility]. Del[facility] at [facility] [phone]  Justin [name] - 36 y.o. male MRN [id number]  Date of birth: Mar 26, 1981  Visit Date: 08/12/2017  PCP: Waldon Merl, PA-C   Referred by: Waldon Merl, PA-C  Orlie Dakin, CMA acting as scribe for Dr. Berline Chough.  SUBJECTIVE:   Chief Complaint  Patient presents with  . New Patient (Initial Visit)    LT shoulder pain   HPI: As below and per problem based documentation when appropriate.  Justin [name] is a new patient presenting today for evaluation of LT shoulder pain. PaPainin is mostly on the anterior aspect of the shoulder. He has noticed a knot that swells up after working all day. He does noticed redness and irritation around the knot after putting stress on it. He has not noticed any drainage from the area.  Pain has been present x 1.5 months.  No known injury or trauma.  The pain is described as shooting pain with occasional numbness and is rated as 7/10 after doing a lot of moving and lifting.  Worsened with rasing arms over his head. He works in [employer] so this is a part of his daily activities.  Improves with rest Therapies tried include : He takes Oxycodone 5-325, Gabapentin, and Celebrex. He applies ice to the shoulder at the end of a long day with some relief.   Other associated symptoms include: Pt has fracture vertebrae, L5-S1, this happened about 6 months ago. He see's Dr. Yetta Barre at [facility] and Dr. Murray Hodgkins for pain management. He is doing PT at [facility] in [place].  Pain radiates into the LT side of the neck but it seems to come and go. Pain is described as a sharp shooting pain that typically lasts about 10 minutes.   No recent xray of the shoulder or c-spine.     Review of Systems  HENT: Negative.   Eyes: Negative.   Respiratory: Negative.   Cardiovascular: Negative.   Gastrointestinal: Positive for heartburn.  Genitourinary:  Negative.   Musculoskeletal: Positive for falls (2.5 weeks ago), myalgias and neck pain.  Skin: Negative.   Neurological: Positive for dizziness, tingling and weakness (LT arm). Negative for headaches.  Endo/Heme/Allergies: Does not bruise/bleed easily.    Otherwise per HPI.   HISTORY & PERTINENT PRIOR DATA:  Prior History reviewed and updated per electronic medical record. Significant history, findings, studies and interim changes include: No additional findings.  reports that he has been smoking cigarettes.  He has been smoking about 0.50 packs per day. he has never used smokeless tobacco. No results for input(s): HGBA1C, LABURIC, CREATINE in the last 8760 hours. No problems updated.  OBJECTIVE:  VS:  HT:6\' 1"  (185.4 cm)   WT:249 lb 9.6 oz (113.2 kg)  BMI:32.94    BP:112/80  HR:74bpm  TEMP: ( )  RESP:94 %  PHYSICAL EXAM: Constitutional: WDWN, Non-toxic appearing. Psychiatric: Alert & appropriately interactive. Not depressed or anxious appearing. Respiratory: No increased work of breathing. Trachea Midline Eyes: Pupils are equal. EOM intact without nystagmus. No scleral icterus  UPPER EXTREMITIES No clubbing or cyanosis appreciated Capillary Refill is normal, less than 2 seconds No signficant upper extremity generalized edema Radial Pulses: Normal and symmetrically palpable Sensation in UE dermatomes: intact to light touch   Left shoulder: Overall well aligned.  Moderate bulging/bossing of the AC joint.  Mild tenderness over this area.  Pain with axial load and circumduction with crepitation localizing  to the [facility] joint.  Pain is worse with overhead motion.  Internal rotation external rotation strength is 5/5.  Empty can testing, speeds testing and O'Brien's testing are all mildly painful with strength that is intact.  ASSESSMENT & PLAN:   1. Left shoulder pain, unspecified chronicity   2. Arthrosis of left acromioclavicular joint    Plan: AC joint injection today.   Degenerative bossing appreciated on ultrasound as well as with x-ray.  Avoid overhead activities.  Follow-up in 6 weeks for clinical reevaluation  No problem-specific Assessment & Plan notes found for this encounter.   ++++++++++++++++++++++++++++++++++++++++++++ Orders:  Orders Placed This Encounter  Procedures  . DG Cervical Spine 2 or 3 views  . DG Shoulder Left  . US GUIDED NEEDLE PLACEMENT(NO LINKED CHARGES)    Meds:  No orders of the defined types were placed in this encounter.   ++++++++++++++++++++++++++++++++++++++++++++ Follow-up: Return in about 6 weeks (around 09/23/2017).   Pertinent documentation may be included in additional procedure notes, imaging studies, problem based documentation and patient instructions. Please see these sections of the encounter for additional information regarding this visit. CMA/ATC served as Neurosurgeon during this visit. History, Physical, and Plan performed by medical provider. Documentation and orders reviewed and attested to.      Andrena Mews, DO    [facility] Phys[facility]

## 2017-08-12 NOTE — Procedures (Signed)
PROCEDURE NOTE -  ULTRASOUND GUIDEDINJECTION: Left AC Joint Images were obtained and interpreted by myself, Gaspar Bidding, DO  Images have been saved and stored to PACS system. Images obtained on: GE S7 Ultrasound machine  ULTRASOUND FINDINGS: Positive mushroom sign  DESCRIPTION OF PROCEDURE:  The patient's clinical condition is marked by substantial pain and/or significant functional disability. Other conservative therapy has not provided relief, is contraindicated, or not appropriate. There is a reasonable likelihood that injection will significantly improve the patient's pain and/or functional impairment. After discussing the risks, benefits and expected outcomes of the injection and all questions were reviewed and answered, the patient wished to undergo the above named procedure. Verbal consent was obtained. The ultrasound was used to identify the target structure and adjacent neurovascular structures. The skin was then prepped in sterile fashion and the target structure was injected under direct visualization using sterile technique as below: PREP: Alcohol, Ethel Chloride APPROACH: Direct inplane, single injection, 25g 1.5" needle INJECTATE: 0.5cc 1% lidocaine, 0.5cc 0.5% marcaine, 0.5cc  DepoMedrol ASPIRATE: N/A DRESSING: Band-Aid  Post procedural instructions including recommending icing and warning signs for infection were reviewed. This procedure was well tolerated and there were no complications.   IMPRESSION: Succesful US Guided Injection

## 2017-08-15 ENCOUNTER — Ambulatory Visit: Payer: BLUE CROSS/BLUE SHIELD | Admitting: Physician Assistant

## 2017-08-18 ENCOUNTER — Encounter: Payer: Self-pay | Admitting: Physician Assistant

## 2017-08-18 ENCOUNTER — Ambulatory Visit (INDEPENDENT_AMBULATORY_CARE_PROVIDER_SITE_OTHER): Payer: BLUE CROSS/BLUE SHIELD | Admitting: Physician Assistant

## 2017-08-18 VITALS — BP 122/82 | HR 64 | Temp 98.1°F | Resp 14 | Ht 73.0 in | Wt 251.0 lb

## 2017-08-18 DIAGNOSIS — F411 Generalized anxiety disorder: Secondary | ICD-10-CM | POA: Diagnosis not present

## 2017-08-18 DIAGNOSIS — N529 Male erectile dysfunction, unspecified: Secondary | ICD-10-CM | POA: Diagnosis not present

## 2017-08-18 MED ORDER — SILDENAFIL CITRATE 20 MG PO TABS: ORAL_TABLET | ORAL | 0 refills | Status: AC

## 2017-08-18 MED ORDER — ALPRAZOLAM 1 MG PO TABS: ORAL_TABLET | ORAL | 0 refills | Status: DC

## 2017-08-18 NOTE — Patient Instructions (Signed)
Please continue medications as directed. I will call you with your results.   Please start the generic viagra as directed for erectile dysfunction. Let me know how this is working.  Take the Zantac twice daily. Take a trial off of Celebrex so we can see if it is actually helping you.  Follow-up with specialists as directed. Follow-up with me in 2 months.

## 2017-08-18 NOTE — Progress Notes (Signed)
Pre visit review using our clinic review tool, if applicable. No additional management support is needed unless otherwise documented below in the visit note. 

## 2017-08-18 NOTE — Progress Notes (Signed)
Patient presents to clinic today for follow-up of anxiety and to discuss erectile dysfunction.  Since last visit, patient has weaned himself to mainly no more than two Xanax per day. Is taking Sertraline and Lamictal as directed. Would like to decrease Xanax quantity further with eventual goal of completely weaning from this medication. Denies breakthrough anxiety. Denies SI/HI.  Patient also endorses long-standing issue with erectile dysfunction. Endorses issue with both getting and maintaining erections sufficient enough for penetration. Denies saddle anesthesia. Denies decreased Libido. Has tried Viagra before with improvement.  Past Medical History:  Diagnosis Date  . Anxiety   . Depression   . GERD (gastroesophageal reflux disease)     Current Outpatient Prescriptions on File Prior to Visit  Medication Sig Dispense Refill  . ALPRAZolam (XANAX) 1 MG tablet TAKE ONE TABLET BY MOUTH 3 TIMES DAILY AS NEEDED FOR ANXIETY 75 tablet 0  . celecoxib (CELEBREX) 200 MG capsule TAKE 1 CAPSULE BY MOUTH TWICE DAILY 60 capsule 0  . EPINEPHrine 0.3 mg/0.3 mL IJ SOAJ injection Inject 0.3 mg into the muscle.   0  . gabapentin (NEURONTIN) 300 MG capsule Take 3 capsules at bedtime  0  . lamoTRIgine (LAMICTAL) 100 MG tablet TAKE ONE TABLET BY MOUTH AT BEDTIME 30 tablet 5  . oxyCODONE-acetaminophen (PERCOCET/ROXICET) 5-325 MG tablet Take 1-2 tablets by mouth every 8 (eight) hours as needed.   0  . ranitidine (ZANTAC) 150 MG tablet Take 150 mg by mouth 2 (two) times daily.    . sertraline (ZOLOFT) 25 MG tablet Take 1 tablet (25 mg total) by mouth daily. 30 tablet 5   No current facility-administered medications on file prior to visit.     Allergies  Allergen Reactions  . Bee Venom Other (See Comments)    Localized swelling  . Hydrocodone-Acetaminophen Nausea Only    Family History  Problem Relation Age of Onset  . Diabetes Father   . Heart disease Father   . Hypertension Father     Social  History   Social History  . Marital status: Married    Spouse name: N/A  . Number of children: N/A  . Years of education: N/A   Social History Main Topics  . Smoking status: Current Every Day Smoker    Packs/day: 0.50    Types: Cigarettes  . Smokeless tobacco: Never Used  . Alcohol use No  . Drug use: No  . Sexual activity: Yes   Other Topics Concern  . None   Social History Narrative  . None   Review of Systems - See HPI.  All other ROS are negative.  BP 122/82   Pulse 64   Temp 98.1 F (36.7 C) (Oral)   Resp 14   Ht 6' 1"  (1.854 m)   Wt 251 lb (113.9 kg)   SpO2 98%   BMI 33.12 kg/m   Physical Exam  Constitutional: He is oriented to person, place, and time and well-developed, well-nourished, and in no distress.  HENT:  Head: Normocephalic and atraumatic.  Eyes: Pupils are equal, round, and reactive to light. Conjunctivae are normal.  Neck: Neck supple.  Cardiovascular: Normal rate, regular rhythm, normal heart sounds and intact distal pulses.   Pulmonary/Chest: Effort normal and breath sounds normal. No respiratory distress. He has no wheezes. He has no rales. He exhibits no tenderness.  Genitourinary: Penis normal.  Lymphadenopathy:    He has no cervical adenopathy.  Neurological: He is alert and oriented to person, place, and time.  Skin:  Skin is warm and dry. No rash noted.  Psychiatric: Affect normal.  Vitals reviewed.  Recent Results (from the past 2160 hour(s))  Comprehensive metabolic panel     Status: Abnormal   Collection Time: 06/08/17  9:01 PM  Result Value Ref Range   Sodium 139 135 - 145 mmol/L   Potassium 3.9 3.5 - 5.1 mmol/L   Chloride 105 101 - 111 mmol/L   CO2 26 22 - 32 mmol/L   Glucose, Bld 116 (H) 65 - 99 mg/dL   BUN 12 6 - 20 mg/dL   Creatinine, Ser 0.77 0.61 - 1.24 mg/dL   Calcium 9.2 8.9 - 10.3 mg/dL   Total Protein 5.9 (L) 6.5 - 8.1 g/dL   Albumin 3.8 3.5 - 5.0 g/dL   AST 30 15 - 41 U/L   ALT 38 17 - 63 U/L   Alkaline  Phosphatase 60 38 - 126 U/L   Total Bilirubin 0.4 0.3 - 1.2 mg/dL   GFR calc non Af Amer >60 >60 mL/min   GFR calc Af Amer >60 >60 mL/min    Comment: (NOTE) The eGFR has been calculated using the CKD EPI equation. This calculation has not been validated in all clinical situations. eGFR's persistently <60 mL/min signify possible Chronic Kidney Disease.    Anion gap 8 5 - 15  CBC with Differential     Status: None   Collection Time: 06/08/17  9:01 PM  Result Value Ref Range   WBC 8.2 4.0 - 10.5 K/uL   RBC 5.05 4.22 - 5.81 MIL/uL   Hemoglobin 14.7 13.0 - 17.0 g/dL   HCT 43.7 39.0 - 52.0 %   MCV 86.5 78.0 - 100.0 fL   MCH 29.1 26.0 - 34.0 pg   MCHC 33.6 30.0 - 36.0 g/dL   RDW 12.2 11.5 - 15.5 %   Platelets 201 150 - 400 K/uL   Neutrophils Relative % 61 %   Neutro Abs 4.9 1.7 - 7.7 K/uL   Lymphocytes Relative 29 %   Lymphs Abs 2.4 0.7 - 4.0 K/uL   Monocytes Relative 7 %   Monocytes Absolute 0.6 0.1 - 1.0 K/uL   Eosinophils Relative 3 %   Eosinophils Absolute 0.3 0.0 - 0.7 K/uL   Basophils Relative 0 %   Basophils Absolute 0.0 0.0 - 0.1 K/uL  I-stat troponin, ED     Status: None   Collection Time: 06/08/17  9:16 PM  Result Value Ref Range   Troponin i, poc 0.00 0.00 - 0.08 ng/mL   Comment 3            Comment: Due to the release kinetics of cTnI, a negative result within the first hours of the onset of symptoms does not rule out myocardial infarction with certainty. If myocardial infarction is still suspected, repeat the test at appropriate intervals.    Assessment/Plan: ;1. Generalized anxiety disorder Continue Sertraline and Lamictal. Will leave Xanax at 2 per day max. Quant 60 refilled. Next goal will be to decrease to 45 tablets. This will be on hold until we get patient's back pain taken care of.  2. Erectile dysfunction, unspecified erectile dysfunction type No anatomical abnormality on exam. Suspect mood medications are a contributor. We are working on slowly  weaning. For now, will give trial of Viagra.   Leeanne Rio, PA-C

## 2017-09-09 ENCOUNTER — Other Ambulatory Visit: Payer: Self-pay | Admitting: Physician Assistant

## 2017-09-09 MED ORDER — ALPRAZOLAM 1 MG PO TABS: ORAL_TABLET | ORAL | 0 refills | Status: DC

## 2017-09-09 NOTE — Telephone Encounter (Signed)
Advised patient rx for #70 [facility].

## 2017-09-09 NOTE — Telephone Encounter (Signed)
Patient requesting CB about Xanax Rx.

## 2017-09-09 NOTE — Telephone Encounter (Signed)
Spoke with patient advised about questions about the Xanax rx. He states at last OV discussed taking the Xanax bid max. Patient states some days he can take the Xanax bid but other days does have to take tid. Between his back pain, wife's surgery and stressors the Xanax #60 is lasting for 30 days.  Please advise about refill

## 2017-09-23 ENCOUNTER — Ambulatory Visit: Payer: BLUE CROSS/BLUE SHIELD | Admitting: Sports Medicine

## 2017-10-06 ENCOUNTER — Telehealth: Payer: Self-pay | Admitting: Physician Assistant

## 2017-10-06 ENCOUNTER — Other Ambulatory Visit: Payer: Self-pay | Admitting: Physician Assistant

## 2017-10-06 NOTE — Telephone Encounter (Signed)
Last rx for Xanax 09/09/2017 #70 Last OV: 08/18/17 Anxiety Last CSC: 04/25/17 Last UDS: none  Please advise

## 2017-10-06 NOTE — Telephone Encounter (Signed)
Pt  Is  Requesting a  Refill  Ox  Xanax    See  Crm [id number]

## 2017-10-06 NOTE — Telephone Encounter (Signed)
Copied from CRM [phone]#[id number]. Topic: Quick Communication - Rx Refill/Question >> Oct 06, 2017 10:57 AM Waymon AmatoBurton, Donna F wrote: Has the patient contacted their pharmacy no   (Agent: If no, request that the patient contact the pharmacy for the refill.)   Preferred Pharmacy (with phone number or street name):    Agent: Please be advised that RX refills may take up to 48 hours. We ask that you follow-up with your pharmacy.

## 2017-10-07 MED ORDER — ALPRAZOLAM 1 MG PO TABS: ORAL_TABLET | ORAL | 0 refills | Status: DC

## 2017-10-07 NOTE — Telephone Encounter (Signed)
Advised patient of rx is ready for pick up at the front desk. He is agreeable with further rx will be weaned down.

## 2017-10-07 NOTE — Telephone Encounter (Signed)
Caller name: Danna Heftyshley Banik (wife)  Relation to pt: wife Call back [phone] [facility] [place] [address] [phone] (Phone) [phone] (Fax)     Reason for call:  Wife checking on the status of medication refill request. Informed wife of office policy regarding controlled substance request allowing the PCP 48 to 72 hours to refill. Spouse voice understanding stating due to the holiday weekend it "threw them off" and patient would like to pick up Rx today, please advise

## 2017-10-07 NOTE — Telephone Encounter (Signed)
Rx refill printed and ready for pick up. Next month we will start weaning down further on this medication.

## 2017-10-15 ENCOUNTER — Encounter: Payer: Self-pay | Admitting: Sports Medicine

## 2017-10-22 ENCOUNTER — Other Ambulatory Visit: Payer: Self-pay | Admitting: Physician Assistant

## 2017-11-05 ENCOUNTER — Other Ambulatory Visit: Payer: Self-pay | Admitting: Physician Assistant

## 2017-11-05 MED ORDER — ALPRAZOLAM 1 MG PO TABS: ORAL_TABLET | ORAL | 0 refills | Status: DC

## 2017-11-05 NOTE — Telephone Encounter (Signed)
Xanax last refilled on 10/07/17 #70 CSC: 04/25/17 Last OV: 08/18/2017 Please advise in PCP absence

## 2017-11-05 NOTE — Telephone Encounter (Signed)
Self.   Refill for Alprazolam    Pharmacy: [facility] - [place] - [address]

## 2017-11-28 ENCOUNTER — Telehealth: Payer: Self-pay | Admitting: Physician Assistant

## 2017-11-28 ENCOUNTER — Other Ambulatory Visit: Payer: Self-pay | Admitting: Family Medicine

## 2017-11-28 ENCOUNTER — Other Ambulatory Visit: Payer: Self-pay | Admitting: Physician Assistant

## 2017-11-28 NOTE — Telephone Encounter (Signed)
Copied from CRM [phone]. Topic: Quick Communication - Rx Refill/Question >> Nov 28, 2017  9:35 AM Anice PaganiniMunoz, Liem Copenhaver I, NT wrote: Medication: Xanax 1 mg Oxycodone 5-325 Mg   Has the patient contacted their pharmacy yes   (Agent: If no, request that the patient contact the pharmacy for the refill yes   Preferred Pharmacy (with phone number or street name   [facility] [phone]   Agent: Please be advised that RX refills may take up to 3 business days. We ask that you follow-up with your pharmacy.

## 2017-11-28 NOTE — Telephone Encounter (Signed)
Last refill of Xanax 11/05/17 #70  Last refill of Oxycodone 07/22/17 #10 LOV: 08/18/17 CSC: 04/25/17  Please advise

## 2017-11-28 NOTE — Telephone Encounter (Signed)
Patient is not due for refill of Xanax. Can be filled on 25th of this month. I do not prescribe his Oxycodone so no refills of this from me.

## 2017-12-02 MED ORDER — CELECOXIB 200 MG PO CAPS: 200.0000 mg | ORAL_CAPSULE | Freq: Two times a day (BID) | ORAL | 3 refills | Status: DC

## 2017-12-02 MED ORDER — SERTRALINE HCL 25 MG PO TABS: 25.0000 mg | ORAL_TABLET | Freq: Every day | ORAL | 3 refills | Status: DC

## 2017-12-02 NOTE — Telephone Encounter (Signed)
Copied from CRM [id number]. Topic: Quick Communication - Rx Refill/Question >> Nov 28, 2017  9:35 AM Anice PaganiniMunoz, Cruz I, NT wrote: Medication: Xanax 1 mg Oxycodone 5-325 Mg   Has the patient contacted their pharmacy yes   (Agent: If no, request that the patient contact the pharmacy for the refill yes   Preferred Pharmacy (with phone number or street name   [facility] [phone]   Agent: Please be advised that RX refills may take up to 3 business days. We ask that you follow-up with your pharmacy. >> Dec 02, 2017  1:39 PM Raquel SarnaHayes, Teresa G wrote: Alprazolam 1 mg - on hand 3 Celecoxib 200 mg - on hand 4 Lamotrigine  100 mg Sertraline 25 mg - on hand 0  Pt has been trying to get the medications since last week.  [facility] - [phone]

## 2017-12-02 NOTE — Telephone Encounter (Signed)
Copied from CRM [id number]. Topic: General - Other >> Dec 02, 2017  1:27 PM Raquel SarnaHayes, Teresa G wrote: Alprazolam 1 mg - on hand 3 Celecoxib 200 mg - on hand 4 Lamotrigine  100 mg Sertraline 25 mg - on hand 0  Pt has been trying to get the medications since last week.  [facility] - [phone]

## 2017-12-02 NOTE — Telephone Encounter (Signed)
Copied from CRM #[id number]. Topic: General - Other °>> Dec 02, 2017  1:27 PM Hayes, Teresa G wrote: °Alprazolam 1 mg - on hand 3 °Celecoxib 200 mg - on hand 4 °Lamotrigine  100 mg °Sertraline 25 mg - on hand 0 ° °Pt has been trying to get the medications since last week. ° °[facility] - [phone] °

## 2017-12-02 NOTE — Telephone Encounter (Signed)
Spoke with patient and advised that his Xanax is not due until the 25th. Patient states he will be out on the 24th. I refilled the Celebrex and Sertraline. Advised patient he is due for a follow up appointment. He states has a new job and has him traveling. He has to get a steady schedule then he will schedule an appointment. Patient did not need refill on the Oxycodone. His back is doing much better.

## 2017-12-03 MED ORDER — ALPRAZOLAM 1 MG PO TABS: ORAL_TABLET | ORAL | 0 refills | Status: DC

## 2017-12-03 NOTE — Telephone Encounter (Signed)
Refill has been sent to fill when due for the Alprazolam.  He needs follow-up appointment before further refills. Also it concerns me that his wife is requesting refills for him for controlled medications he is no longer on. Further refill requests are to come from him or the pharmacy only.

## 2017-12-04 NOTE — Telephone Encounter (Signed)
Patient advised rx sent to the pharmacy. Will make an appointment as soon as possible with new job schedule. Will only contact patient when medications are refilled

## 2017-12-31 ENCOUNTER — Other Ambulatory Visit: Payer: Self-pay | Admitting: Physician Assistant

## 2017-12-31 NOTE — Telephone Encounter (Signed)
Copied from CRM [id number]. Topic: Quick Communication - Rx Refill/Question >> Dec 31, 2017 10:18 AM Herby AbrahamJohnson, Shiquita C wrote: Medication: ALPRAZolam Prudy Feeler(XANAX) 1 MG tablet---- pt is requesting 60 tablets per he and providers conversation    Has the patient contacted their pharmacy? no   (Agent: If no, request that the patient contact the pharmacy for the refill.)   Preferred Pharmacy (with phone number or street name): [facility] - [place] - [address]   Agent: Please be advised that RX refills may take up to 3 business days. We ask that you follow-up with your pharmacy.

## 2018-01-01 MED ORDER — ALPRAZOLAM 1 MG PO TABS: 1.0000 mg | ORAL_TABLET | Freq: Two times a day (BID) | ORAL | 0 refills | Status: DC | PRN

## 2018-01-01 NOTE — Telephone Encounter (Signed)
Refill sent with new sig and quantity. Patient due for a follow-up.

## 2018-01-01 NOTE — Telephone Encounter (Signed)
LMOVM advising patient that rx was sent to the pharmacy with #60 quantity and to schedule a follow up appointment with PCP.

## 2018-01-01 NOTE — Addendum Note (Signed)
Addended by: Waldon MerlMARTIN, Fany Cavanaugh C on: 01/01/2018 10:19 AM   Modules accepted: Orders

## 2018-01-27 ENCOUNTER — Other Ambulatory Visit: Payer: Self-pay | Admitting: Physician Assistant

## 2018-01-27 NOTE — Telephone Encounter (Signed)
Copied from CRM [phone] [id number]. Topic: Quick Communication - Rx Refill/Question >> Jan 27, 2018  3:29 PM Raquel SarnaHayes, Teresa G wrote: ALPRAZolam Prudy Feeler(XANAX) 1 MG tablet  Requesting a refill - may be a little early - just didn't want to run out w/ the 72 hr refill policy.  [facility] - [place] - [address] [place] [id number] Phone: [phone] Fax: [phone] Not a 24 hour pharmacy; exact hours not known

## 2018-01-28 NOTE — Telephone Encounter (Signed)
Refill request of Xanax. Last filled on 01/01/18 #60 CSC: 04/25/17 UDS: none Next appt on 02/02/18  Please advise

## 2018-01-28 NOTE — Telephone Encounter (Signed)
LOV  08/18/17 Justin MatesWilliam Martin [facility]

## 2018-01-29 ENCOUNTER — Telehealth: Payer: Self-pay | Admitting: Physician Assistant

## 2018-01-29 ENCOUNTER — Other Ambulatory Visit: Payer: Self-pay | Admitting: Physician Assistant

## 2018-01-29 NOTE — Telephone Encounter (Signed)
Patient states that he has made his appointment for Monday 02/02/18, but he will be out of Xanax on Friday 01/30/18 and is wondering if he can get anything for the weekend.   Routing to PCP to advise.

## 2018-01-29 NOTE — Telephone Encounter (Signed)
Pt requesting refill for xanax.  Last refill was 0221/19 for 60 tablets.  LOV   08/18/17 NOV  02/02/18  Provider:  Malva Coganody Martin, PA  Pharmacy :[facility] [address]  [place]  Please review.

## 2018-01-29 NOTE — Telephone Encounter (Signed)
Copied from CRM [id number]. Topic: Quick Communication - Rx Refill/Question >> Jan 27, 2018  3:29 PM Raquel SarnaHayes, Teresa G wrote: ALPRAZolam Prudy Feeler(XANAX) 1 MG tablet  Requesting a refill - may be a little early - just didn't want to run out w/ the 72 hr refill policy.  [facility] - [place] - [address] [place] [id number] Phone: [phone] Fax: [phone] Not a 24 hour pharmacy; exact hours not known   >> Jan 29, 2018 12:37 PM Raquel SarnaHayes, Teresa G wrote: Pt called back to give new cell number and to check on status of his Rx refill.

## 2018-01-30 MED ORDER — ALPRAZOLAM 1 MG PO TABS: 1.0000 mg | ORAL_TABLET | Freq: Two times a day (BID) | ORAL | 0 refills | Status: DC | PRN

## 2018-01-30 NOTE — Telephone Encounter (Signed)
Refill sent.

## 2018-01-30 NOTE — Telephone Encounter (Signed)
Rx sent in this morning. 

## 2018-02-02 ENCOUNTER — Ambulatory Visit: Payer: 59 | Admitting: Physician Assistant

## 2018-02-02 ENCOUNTER — Other Ambulatory Visit: Payer: Self-pay

## 2018-02-02 ENCOUNTER — Encounter: Payer: Self-pay | Admitting: Physician Assistant

## 2018-02-02 VITALS — BP 128/88 | HR 74 | Temp 98.3°F | Resp 16 | Ht 73.0 in | Wt 257.0 lb

## 2018-02-02 DIAGNOSIS — F411 Generalized anxiety disorder: Secondary | ICD-10-CM

## 2018-02-02 MED ORDER — BUSPIRONE HCL 5 MG PO TABS: 5.0000 mg | ORAL_TABLET | Freq: Two times a day (BID) | ORAL | 0 refills | Status: DC

## 2018-02-02 NOTE — Progress Notes (Signed)
Patient presents to clinic today for follow-up of anxiety/depression. Patient is currently on a regimen of Lamictal 100 mg QD, Sertraline 25 mg QD and Alprazolam 1 mg BID PRN. Is taking medications as directed and tolerating well. Is trying to wean down further on Xanax. Is currently taking BID and having a hard time weaning further due to anxiety levels. Denies depressed mood currently with Lamictal. Denies SI/HI.  Past Medical History:  Diagnosis Date  . Anxiety   . Depression   . GERD (gastroesophageal reflux disease)     Current Outpatient Medications on File Prior to Visit  Medication Sig Dispense Refill  . ALPRAZolam (XANAX) 1 MG tablet Take 1 tablet (1 mg total) by mouth 2 (two) times daily as needed for anxiety. 60 tablet 0  . celecoxib (CELEBREX) 200 MG capsule Take 1 capsule (200 mg total) by mouth 2 (two) times daily. 60 capsule 3  . EPINEPHrine 0.3 mg/0.3 mL IJ SOAJ injection Inject 0.3 mg into the muscle.   0  . gabapentin (NEURONTIN) 300 MG capsule Take 3 capsules at bedtime  0  . lamoTRIgine (LAMICTAL) 100 MG tablet TAKE ONE TABLET BY MOUTH AT BEDTIME 30 tablet 5  . oxyCODONE-acetaminophen (PERCOCET/ROXICET) 5-325 MG tablet Take 1-2 tablets by mouth every 8 (eight) hours as needed.   0  . ranitidine (ZANTAC) 150 MG tablet Take 150 mg by mouth 2 (two) times daily.    . sertraline (ZOLOFT) 25 MG tablet Take 1 tablet (25 mg total) by mouth daily. 30 tablet 3  . sildenafil (REVATIO) 20 MG tablet Take 1-2 tablets before sexual intercourse. No more than one dose in 24 hours. 10 tablet 0   No current facility-administered medications on file prior to visit.     Allergies  Allergen Reactions  . Bee Venom Other (See Comments)    Localized swelling  . Hydrocodone-Acetaminophen Nausea Only    Family History  Problem Relation Age of Onset  . Diabetes Father   . Heart disease Father   . Hypertension Father     Social History   Socioeconomic History  . Marital status:  Married    Spouse name: Not on file  . Number of children: Not on file  . Years of education: Not on file  . Highest education level: Not on file  Occupational History  . Not on file  Social Needs  . Financial resource strain: Not on file  . Food insecurity:    Worry: Not on file    Inability: Not on file  . Transportation needs:    Medical: Not on file    Non-medical: Not on file  Tobacco Use  . Smoking status: Current Every Day Smoker    Packs/day: 0.50    Types: Cigarettes  . Smokeless tobacco: Never Used  Substance and Sexual Activity  . Alcohol use: No    Alcohol/week: 0.0 oz  . Drug use: No  . Sexual activity: Yes  Lifestyle  . Physical activity:    Days per week: Not on file    Minutes per session: Not on file  . Stress: Not on file  Relationships  . Social connections:    Talks on phone: Not on file    Gets together: Not on file    Attends religious service: Not on file    Active member of club or organization: Not on file    Attends meetings of clubs or organizations: Not on file    Relationship status: Not on file  Other Topics Concern  . Not on file  Social History Narrative  . Not on file   Review of Systems - See HPI.  All other ROS are negative.  BP 128/88   Pulse 74   Temp 98.3 F (36.8 C) (Oral)   Resp 16   Ht 6\' 1"  (1.854 m)   Wt 257 lb (116.6 kg)   SpO2 98%   BMI 33.91 kg/m   Physical Exam  Constitutional: He is oriented to person, place, and time and well-developed, well-nourished, and in no distress.  HENT:  Head: Normocephalic and atraumatic.  Eyes: Conjunctivae are normal.  Neck: Neck supple.  Cardiovascular: Normal rate, regular rhythm, normal heart sounds and intact distal pulses.  Pulmonary/Chest: Effort normal and breath sounds normal. No respiratory distress. He has no wheezes. He has no rales. He exhibits no tenderness.  Neurological: He is alert and oriented to person, place, and time.  Skin: Skin is warm and dry. No rash  noted.  Psychiatric: Affect normal.  Vitals reviewed.  Assessment/Plan: Generalized anxiety disorder Continue Lamictal and start the BuSpar 5 mg BID. This is to help lower need for the Xanax. Will give 60 tablets of the Xanax this month with plan for this to last 1-2 months. Will continue further wean. CPE in 4 weeks.  Arthrosis of left acromioclavicular joint Patient to schedule follow-up with Dr. Berline Choughigby.    Piedad ClimesWilliam Cody Nella Botsford, PA-C

## 2018-02-02 NOTE — Assessment & Plan Note (Signed)
Patient to schedule follow-up with Dr. Berline Choughigby.

## 2018-02-02 NOTE — Patient Instructions (Signed)
Please continue the Lamictal. Start the BuSpar twice daily.  Work on weaning the Xanax down to 1 per day over the next 2 weeks. Can take another for severe anxiety only. Our goal is to continue weaning down off of this.  Schedule a follow-up with Dr. Berline Choughigby.  Follow-up with me in 4-6 weeks for a complete physical.

## 2018-02-02 NOTE — Assessment & Plan Note (Signed)
Continue Lamictal and start the BuSpar 5 mg BID. This is to help lower need for the Xanax. Will give 60 tablets of the Xanax this month with plan for this to last 1-2 months. Will continue further wean. CPE in 4 weeks.

## 2018-02-09 ENCOUNTER — Encounter: Payer: Self-pay | Admitting: Sports Medicine

## 2018-02-09 ENCOUNTER — Ambulatory Visit: Payer: 59 | Admitting: Sports Medicine

## 2018-02-09 ENCOUNTER — Ambulatory Visit: Payer: Self-pay

## 2018-02-09 VITALS — BP 124/82 | HR 69 | Ht 73.0 in | Wt 256.0 lb

## 2018-02-09 DIAGNOSIS — M19012 Primary osteoarthritis, left shoulder: Secondary | ICD-10-CM

## 2018-02-09 DIAGNOSIS — M25512 Pain in left shoulder: Secondary | ICD-10-CM

## 2018-02-09 NOTE — Patient Instructions (Signed)

## 2018-02-09 NOTE — Progress Notes (Signed)
  Veverly FellsMichael D. Delorise Shinerigby, DO  [facility] [place] [phone]  Justin FlockDaniel Glasco - 37 y.o. male MRN [id number]  Date of birth: [birth year]  Visit Date: 02/09/2018  PCP: Waldon MerlMartin, William C, PA-C   Referred by: Waldon MerlMartin, William C, PA-C  Scribe for today's visit: Stevenson ClinchBrandy Coleman, CMA     SUBJECTIVE:  Justin [name] is here for Follow-up (L shoulder pain)  08/12/2017: Justin FlockDaniel Flaum is a new patient presenting today for evaluation of LT shoulder pain. PaPainin is mostly on the anterior aspect of the shoulder. He has noticed a knot that swells up after working all day. He does noticed redness and irritation around the knot after putting stress on it. He has not noticed any drainage from the area.  Pain has been present x 1.5 months.  No known injury or trauma. The pain is described as shooting pain with occasional numbness and is rated as 7/10 after doing a lot of moving and lifting. Worsened with rasing arms over his head. He works in [employer] so this is a part of his daily activities.  Improves with rest Therapies tried include : He takes Oxycodone 5-325, Gabapentin, and Celebrex. He applies ice to the shoulder at the end of a long day with some relief.  Other associated symptoms include: Pt has fracture vertebrae, L5-S1, this happened about 6 months ago. He see's [facility] at [facility] and Dr. Murray HodgkinsBartko for pain management. He is doing PT at [facility] in [place].  Pain radiates into the LT side of the neck but it seems to come and go. Pain is described as a sharp shooting pain that typically lasts about 10 minutes.  No recent xray of the shoulder or c-spine.   02/09/2018: Compared to the last office visit, his previously described symptoms show no change. He did get relief after injection in October 2018, pain started to flare up again 12/2017.  Current symptoms are moderate & are radiating to L side of neck and L arm. At times pain can become severe.  He has  been taking oxycodone, Celebrex, and Gabapentin (for his back). He has been alternating heat and ice for his shoulder and back pain.   ROS Denies night time disturbances. Denies fevers, chills, or night sweats. Denies unexplained weight loss. Denies personal history of cancer. Denies changes in bowel or bladder habits. Reports recent unreported falls. Denies new or worsening dyspnea or wheezing. Denies headaches or dizziness.  Reports numbness, tingling or weakness  In the extremities.  Denies dizziness or presyncopal episodes Denies lower extremity edema      Please see additional documentation for Objective, Assessment and Plan sections. Pertinent additional documentation may be included in corresponding procedure notes, imaging studies, problem based documentation and patient instructions. Please see these sections of the encounter for additional information regarding this visit.  CMA/ATC served as Neurosurgeonscribe during this visit. History, Physical, and Plan performed by medical provider. Documentation and orders reviewed and attested to.      Andrena MewsMichael D Jehieli Brassell, DO    [facility]

## 2018-02-09 NOTE — Progress Notes (Signed)
   Justin FlockDaniel [name] - 37 y.o. male MRN [id number]  Date of birth: [birth year]  Visit Date:   PCP: Waldon MerlMartin, William C, PA-C   Referred by: Waldon MerlMartin, William C, PA-C  Please see additional documentation for HPI, review of systems.  HISTORY & PERTINENT PRIOR DATA:  Prior History reviewed and updated per electronic medical record.  Significant/pertinent history, findings, studies include:  reports that he has been smoking cigarettes.  He has been smoking about 0.50 packs per day. He has never used smokeless tobacco. No results for input(s): HGBA1C, LABURIC, CREATINE in the last 8760 hours. No specialty comments available. No problems updated.  OBJECTIVE:  VS:  HT:6\' 1"  (185.4 cm)   WT:256 lb (116.1 kg)  BMI:33.78    BP:124/82  HR:69bpm  TEMP: ( )  RESP:95 %   PHYSICAL EXAM: WDWN, Non-toxic appearing. Psychiatric: Alert & appropriately interactive.  Not depressed or anxious appearing. Respiratory: No increased work of breathing.  Trachea Midline Eyes: Pupils are equal.  EOM intact without nystagmus.  No scleral icterus  Vascular Exam: warm to touch no edema  upper extremity neuro exam: unremarkable normal strength normal sensation normal reflexes  MSK Exam: TTP over the left AC joint.  Internal and external rotation strength is intact.   ASSESSMENT & PLAN:   1. Left shoulder pain, unspecified chronicity   2. Arthrosis of left acromioclavicular joint     PLAN: Repeat injection performed today.  Follow-up as needed  Follow-up: Return if symptoms worsen or fail to improve.

## 2018-02-09 NOTE — Procedures (Signed)
PROCEDURE NOTE:  Ultrasound Guided: Injection: [facility] Images were obtained and interpreted by myself, Gaspar BiddingMichael Arena Lindahl, DO  Images have been saved and stored to PACS system. Images obtained on: GE S7 Ultrasound machine    ULTRASOUND FINDINGS:  + mushroom sign  DESCRIPTION OF PROCEDURE:  The patient's clinical condition is marked by substantial pain and/or significant functional disability. Other conservative therapy has not provided relief, is contraindicated, or not appropriate. There is a reasonable likelihood that injection will significantly improve the patient's pain and/or functional impairment.   After discussing the risks, benefits and expected outcomes of the injection and all questions were reviewed and answered, the patient wished to undergo the above named procedure.  Verbal consent was obtained.  The ultrasound was used to identify the target structure and adjacent neurovascular structures. The skin was then prepped in sterile fashion and the target structure was injected under direct visualization using sterile technique as below:  PREP: Alcohol and Ethel Chloride APPROACH: direct, single injection, 25g 1.5 in. INJECTATE: 0.5 cc 0.5% Marcaine and 0.5 cc 40mg /mL DepoMedrol ASPIRATE: None DRESSING: Band-Aid  Post procedural instructions including recommending icing and warning signs for infection were reviewed.    This procedure was well tolerated and there were no complications.   IMPRESSION: Succesful Ultrasound Guided: Injection

## 2018-02-10 ENCOUNTER — Other Ambulatory Visit: Payer: Self-pay | Admitting: Neurological Surgery

## 2018-02-10 DIAGNOSIS — M4317 Spondylolisthesis, lumbosacral region: Secondary | ICD-10-CM

## 2018-02-13 NOTE — Progress Notes (Signed)
Phone call to pt to verify medication list and allergies. Pt informed to stop Buspar and Zoloft 48 hrs prior to appt time for myelogram procedure. Pt verbalized understanding.

## 2018-03-02 ENCOUNTER — Other Ambulatory Visit: Payer: 59

## 2018-03-02 ENCOUNTER — Other Ambulatory Visit: Payer: Self-pay | Admitting: Physician Assistant

## 2018-03-02 DIAGNOSIS — Z79899 Other long term (current) drug therapy: Secondary | ICD-10-CM

## 2018-03-02 MED ORDER — ALPRAZOLAM 1 MG PO TABS: 1.0000 mg | ORAL_TABLET | Freq: Two times a day (BID) | ORAL | 0 refills | Status: DC | PRN

## 2018-03-02 NOTE — Telephone Encounter (Signed)
Patient aware of picking up rx for Xanax and providing a UDS.

## 2018-03-02 NOTE — Telephone Encounter (Signed)
Rx refill request: Xanax 1 mg    last filled 01/30/18 #60  LOV: 02/02/18  PCP: Daphine DeutscherMartin  Pharmacy: verified

## 2018-03-02 NOTE — Telephone Encounter (Signed)
Refill request for Xanax Last filled on 01/30/18 #60 Last OV: 02/02/18 Anxiety Last CSC: 04/25/17 No UDS  Please advise

## 2018-03-02 NOTE — Telephone Encounter (Signed)
Copied from CRM [id number]. Topic: Quick Communication - Rx Refill/Question >> Mar 02, 2018  8:08 AM Landry MellowFoltz, Melissa J wrote: Medication: ALPRAZolam Prudy Feeler(XANAX) 1 MG tablet    Has the patient contacted their pharmacy? No. (Agent: If no, request that the patient contact the pharmacy for the refill.) Preferred Pharmacy (with phone number or street name): [facility] Agent: Please be advised that RX refills may take up to 3 business days. We ask that you follow-up with your pharmacy. Pt said that he thinks that the other anxiety medication is starting to help, but he still needs refill on xanax.   Pt will be out this evening.

## 2018-03-02 NOTE — Telephone Encounter (Signed)
Rx printed. He will need to pick up as he is due for a UDS.

## 2018-03-06 LAB — PAIN MGMT, PROFILE 8 W/CONF, U
6 Acetylmorphine: NEGATIVE ng/mL
Alcohol Metabolites: NEGATIVE ng/mL
Alphahydroxyalprazolam: 250 ng/mL — ABNORMAL HIGH
Alphahydroxymidazolam: NEGATIVE ng/mL
Alphahydroxytriazolam: NEGATIVE ng/mL
Aminoclonazepam: NEGATIVE ng/mL
Amphetamines: NEGATIVE ng/mL
Benzodiazepines: POSITIVE ng/mL — AB
Buprenorphine, Urine: NEGATIVE ng/mL
Cocaine Metabolite: NEGATIVE ng/mL
Codeine: NEGATIVE ng/mL
Creatinine: 153.2 mg/dL
Hydrocodone: NEGATIVE ng/mL
Hydromorphone: NEGATIVE ng/mL
Hydroxyethylflurazepam: NEGATIVE ng/mL
Lorazepam: NEGATIVE ng/mL
MDMA: NEGATIVE ng/mL
Marijuana Metabolite: NEGATIVE ng/mL
Morphine: NEGATIVE ng/mL
Nordiazepam: NEGATIVE ng/mL
Norhydrocodone: NEGATIVE ng/mL
Noroxycodone: 2052 ng/mL — ABNORMAL HIGH
Opiates: NEGATIVE ng/mL
Oxazepam: NEGATIVE ng/mL
Oxidant: NEGATIVE ug/mL
Oxycodone: 2334 ng/mL — ABNORMAL HIGH
Oxycodone: POSITIVE ng/mL — AB
Oxymorphone: 1929 ng/mL — ABNORMAL HIGH
Temazepam: NEGATIVE ng/mL
pH: 6.44 (ref 4.5–9.0)

## 2018-03-10 ENCOUNTER — Ambulatory Visit: Payer: 59 | Admitting: Physician Assistant

## 2018-03-17 ENCOUNTER — Encounter: Payer: Self-pay | Admitting: Physician Assistant

## 2018-03-17 ENCOUNTER — Other Ambulatory Visit: Payer: Self-pay

## 2018-03-17 ENCOUNTER — Ambulatory Visit: Payer: 59 | Admitting: Physician Assistant

## 2018-03-17 VITALS — BP 112/84 | HR 74 | Temp 98.2°F | Resp 14 | Ht 73.0 in | Wt 252.0 lb

## 2018-03-17 DIAGNOSIS — F3341 Major depressive disorder, recurrent, in partial remission: Secondary | ICD-10-CM

## 2018-03-17 DIAGNOSIS — K219 Gastro-esophageal reflux disease without esophagitis: Secondary | ICD-10-CM

## 2018-03-17 DIAGNOSIS — F411 Generalized anxiety disorder: Secondary | ICD-10-CM | POA: Diagnosis not present

## 2018-03-17 MED ORDER — BUSPIRONE HCL 7.5 MG PO TABS: 7.5000 mg | ORAL_TABLET | Freq: Two times a day (BID) | ORAL | 2 refills | Status: DC

## 2018-03-17 MED ORDER — RABEPRAZOLE SODIUM 20 MG PO TBEC: 20.0000 mg | DELAYED_RELEASE_TABLET | Freq: Every day | ORAL | 3 refills | Status: DC

## 2018-03-17 MED ORDER — SUCRALFATE 1 G PO TABS
1.0000 g | ORAL_TABLET | Freq: Three times a day (TID) | ORAL | 0 refills | Status: DC
Start: 2018-03-17 — End: 2018-06-30

## 2018-03-17 NOTE — Patient Instructions (Signed)
Please start the Aciphex as directed. Cut back Ranitidine to two tablets (300 mg) each evening as you are taking more than as directed.   The Carafate is to coat the stomach for now to help irritation heal.  Follow the diet below.  Follow-up with me in 1 week for reassessment.   I do want you to limit the use of the Celebrex which may be exacerbating your reflux symptoms.   For the mood, start the new dose of BuSpar as directed.

## 2018-03-17 NOTE — Progress Notes (Signed)
Patient presents to clinic today for follow-up of anxiety/depression. At last visit, BuSpar was added to patient's regimen. Since then, patient notes improvement in anxiety with decrease use in anxiety. Only rare panic attack. Mood is stable. Denies SI/HI.   Patient has noted signifcant GERD over the past week despite no change in diet. Does eat late at night but avoids fast good. Does note a decent amount of spicy food intake. Was previously on Prilosec that was subtherapeutic. Got better relief with Ranitidine 150 mg BID but is not taking 300 mg BID that is not helping. Denies blood in stool.   Past Medical History:  Diagnosis Date  . Anxiety   . Depression   . GERD (gastroesophageal reflux disease)     Current Outpatient Medications on File Prior to Visit  Medication Sig Dispense Refill  . ALPRAZolam (XANAX) 1 MG tablet Take 1 tablet (1 mg total) by mouth 2 (two) times daily as needed for anxiety. 60 tablet 0  . EPINEPHrine 0.3 mg/0.3 mL IJ SOAJ injection Inject 0.3 mg into the muscle.   0  . gabapentin (NEURONTIN) 300 MG capsule Take 3 capsules at bedtime  0  . lamoTRIgine (LAMICTAL) 100 MG tablet TAKE ONE TABLET BY MOUTH AT BEDTIME 30 tablet 5  . oxyCODONE-acetaminophen (PERCOCET) 7.5-325 MG tablet Take 1 tablet by mouth every 6 (six) hours as needed.   0  . sertraline (ZOLOFT) 25 MG tablet Take 1 tablet (25 mg total) by mouth daily. 30 tablet 3  . sildenafil (REVATIO) 20 MG tablet Take 1-2 tablets before sexual intercourse. No more than one dose in 24 hours. 10 tablet 0   No current facility-administered medications on file prior to visit.     Allergies  Allergen Reactions  . Bee Venom Other (See Comments)    Localized swelling  . Hydrocodone-Acetaminophen Nausea Only    Family History  Problem Relation Age of Onset  . Diabetes Father   . Heart disease Father   . Hypertension Father     Social History   Socioeconomic History  . Marital status: Married    Spouse  name: Not on file  . Number of children: Not on file  . Years of education: Not on file  . Highest education level: Not on file  Occupational History  . Not on file  Social Needs  . Financial resource strain: Not on file  . Food insecurity:    Worry: Not on file    Inability: Not on file  . Transportation needs:    Medical: Not on file    Non-medical: Not on file  Tobacco Use  . Smoking status: Current Every Day Smoker    Packs/day: 0.50    Types: Cigarettes  . Smokeless tobacco: Never Used  Substance and Sexual Activity  . Alcohol use: No    Alcohol/week: 0.0 oz  . Drug use: No  . Sexual activity: Yes  Lifestyle  . Physical activity:    Days per week: Not on file    Minutes per session: Not on file  . Stress: Not on file  Relationships  . Social connections:    Talks on phone: Not on file    Gets together: Not on file    Attends religious service: Not on file    Active member of club or organization: Not on file    Attends meetings of clubs or organizations: Not on file    Relationship status: Not on file  Other Topics Concern  .  Not on file  Social History Narrative  . Not on file    Review of Systems - See HPI.  All other ROS are negative.  BP 112/84   Pulse 74   Temp 98.2 F (36.8 C) (Oral)   Resp 14   Ht  (1.854 m)   Wt 252 lb (114.3 kg)   SpO2 98%   BMI 33.25 kg/m   Physical Exam  Constitutional: He is oriented to person, place, and time. He appears well-developed and well-nourished.  HENT:  Head: Normocephalic and atraumatic.  Cardiovascular: Normal rate, regular rhythm and normal heart sounds.  Pulmonary/Chest: Effort normal.  Neurological: He is alert and oriented to person, place, and time.  Vitals reviewed.  Recent Results (from the past 2160 hour(s))  Pain Mgmt, Profile 8 w/Conf, U     Status: Abnormal   Collection Time: 03/02/18  2:37 PM  Result Value Ref Range   Creatinine 153.2 > or = 20. mg/dL   pH 1.61 4.5 - 9.0   Oxidant  NEGATIVE <200 mcg/mL   Amphetamines NEGATIVE <500 ng/mL   medMATCH Amphetamines CONSISTENT    Benzodiazepines POSITIVE (A) <100 ng/mL   Alphahydroxyalprazolam 250 (H) <25 ng/mL    Comment: See Note 1   medMATCH aOH alprazolam INCONSISTENT    Alphahydroxymidazolam NEGATIVE <50 ng/mL    Comment: See Note 1   medMATCH aOH midazolam CONSISTENT    Alphahydroxytriazolam NEGATIVE <50 ng/mL    Comment: See Note 1   medMATCH aOH triazolam CONSISTENT    Aminoclonazepam NEGATIVE <25 ng/mL    Comment: See Note 1   medMATCH Aminoclonazepam CONSISTENT    Hydroxyethylflurazepam NEGATIVE <50 ng/mL    Comment: See Note 1   medMATCH OH,Et flurazepam CONSISTENT    Lorazepam NEGATIVE <50 ng/mL    Comment: See Note 1   medMATCH Lorazepam CONSISTENT    Nordiazepam NEGATIVE <50 ng/mL    Comment: See Note 1   medMATCH Nordiazepam CONSISTENT    Oxazepam NEGATIVE <50 ng/mL    Comment: See Note 1   medMATCH Oxazepam CONSISTENT    Temazepam NEGATIVE <50 ng/mL    Comment: See Note 1   medMATCH Temazepam CONSISTENT    Marijuana Metabolite NEGATIVE <20 ng/mL   medMATCH Marijuana Metab CONSISTENT    Cocaine Metabolite NEGATIVE <150 ng/mL   medMATCH Cocaine Metab CONSISTENT    Opiates NEGATIVE CONFIRMED <100 ng/mL   Codeine NEGATIVE <50 ng/mL    Comment: See Note 1   medMATCH Codeine CONSISTENT    Hydrocodone NEGATIVE <50 ng/mL    Comment: See Note 1   medMATCH Hydrocodone CONSISTENT    Hydromorphone NEGATIVE <50 ng/mL    Comment: See Note 1   medMATCH Hydromorphone CONSISTENT    Morphine NEGATIVE <50 ng/mL    Comment: See Note 1   medMATCH Morphine CONSISTENT    Norhydrocodone NEGATIVE <50 ng/mL    Comment: See Note 1   medMATCH Norhydrocodone CONSISTENT    Oxycodone POSITIVE (A) <100 ng/mL   Noroxycodone 2,052 (H) <50 ng/mL    Comment: See Note 1   medMATCH Noroxycodone INCONSISTENT     Comment: See Note 2   Oxycodone 2,334 (H) <50 ng/mL    Comment: See Note 1   medMATCH Oxycodone  INCONSISTENT    Oxymorphone 1,929 (H) <50 ng/mL    Comment: See Note 1   medMATCH Oxymorphone INCONSISTENT     Comment: See Note 3 . Note 1 . This test was developed and its analytical performance  characteristics have been determined by [employer]. It has not been cleared or approved by the FDA. This assay has been validated pursuant to the CLIA  regulations and is used for clinical purposes. . Note 2 Noroxycodone is a metabolite of Oxycodone. . Note 3 Oxymorphone is a metabolite of oxycodone as well as  a prescribed drug.    Buprenorphine, Urine NEGATIVE <5 ng/mL   medMATCH Buprenorphine CONSISTENT    MDMA NEGATIVE <500 ng/mL   [facility] MDMA CONSISTENT    Alcohol Metabolites NEGATIVE <500 ng/mL   medMATCH Alcohol Metab CONSISTENT    6 Acetylmorphine NEGATIVE <10 ng/mL   medMATCH 6 Acetylmorphine CONSISTENT     Comment: This drug testing is for medical treatment only.   Analysis was performed as non-forensic testing and  these results should be used only by healthcare  providers to render diagnosis or treatment, or to  monitor progress of medical conditions. Sharyn Lull comments are:  - present when drug test results may be the result of     metabolism of one or more drugs or when results are     inconsistent with prescribed medication(s) listed.  - may be blank when drug results are consistent with     prescribed medication(s) listed. . For assistance with interpreting these drug results,  please contact a [employer] Toxicology  Specialist: [phone] TOX [phone]), M-F,  8am-6pm EST. This drug testing is for medical treatment only.   Analysis was performed as non-forensic testing and  these results should be used only by healthcare  providers to render diagnosis or treatment, or to  monitor progress of medical conditions. Sharyn Lull comments are:  - present when  drug test results may be the result of     metabolism of one or more drugs  or when results are     inconsistent with prescribed medication(s) listed.  - may be blank when drug results are consistent with     prescribed medication(s) listed. . For assistance with interpreting these drug results,  please contact a [employer] Toxicology  Specialist: [phone] TOX [phone]), M-F,  8am-6pm EST. This drug testing is for medical treatment only.   Analysis was performed as non-forensic testing and  these results should be used only by healthcare  providers to render diagnosis or treatment, or to  monitor progress of medical conditions. Sharyn Lull comments are:  - present when drug test results may be the result of     metabolism of one or more drugs or when results are     inconsistent with prescribed medication(s) listed.  - may be blank when drug results are consistent with     prescribed medication(s) listed. . For assistance with interpreting these d rug results,  please contact a [employer] Toxicology  Specialist: [phone] TOX [phone]), M-F,  8am-6pm EST. This drug testing is for medical treatment only.   Analysis was performed as non-forensic testing and  these results should be used only by healthcare  providers to render diagnosis or treatment, or to  monitor progress of medical conditions. Sharyn Lull comments are:  - present when drug test results may be the result of     metabolism of one or more drugs or when results are     inconsistent with prescribed medication(s) listed.  - may be blank when drug results are consistent with     prescribed medication(s) listed. . For assistance with interpreting these drug results,  please contact a [employer] Toxicology  Specialist: [phone] TOX [phone]), M-F,  8am-6pm EST. This drug testing is for medical treatment only.   Analysis was performed as non-forensic testing and  these results should be used only by healthcare  provi ders to render  diagnosis or treatment, or to  monitor progress of medical conditions. Sharyn Lull comments are:  - present when drug test results may be the result of     metabolism of one or more drugs or when results are     inconsistent with prescribed medication(s) listed.  - may be blank when drug results are consistent with     prescribed medication(s) listed. . For assistance with interpreting these drug results,  please contact a [employer] Toxicology  Specialist: [phone] TOX [phone]), M-F,  8am-6pm EST.     Assessment/Plan: Generalized anxiety disorder Improving. Has helped him cut back on Xanax which is fantastic as we have been trying to wean. Will increase BuSpar to 7.5 mg BID.  Depression Stable. Continue current regimen.   GERD (gastroesophageal reflux disease) Deteriorated. Strict diet needed. Is taking Ranitidine more than as directed. Cut back to 300 mg QHS.  Start Aciphex daily. GERD dietary handout given. Follow-up discussed.     Piedad Climes, PA-C

## 2018-03-18 NOTE — Assessment & Plan Note (Signed)
Deteriorated. Strict diet needed. Is taking Ranitidine more than as directed. Cut back to 300 mg QHS.  Start Aciphex daily. GERD dietary handout given. Follow-up discussed.

## 2018-03-18 NOTE — Assessment & Plan Note (Signed)
Improving. Has helped him cut back on Xanax which is fantastic as we have been trying to wean. Will increase BuSpar to 7.5 mg BID.

## 2018-03-18 NOTE — Assessment & Plan Note (Signed)
Stable  Continue current regimen  

## 2018-03-20 ENCOUNTER — Ambulatory Visit
Admission: RE | Admit: 2018-03-20 | Discharge: 2018-03-20 | Disposition: A | Discharge status: Home / Self Care | Payer: 59 | Source: Ambulatory Visit | Attending: Neurological Surgery | Admitting: Neurological Surgery

## 2018-03-20 DIAGNOSIS — M4317 Spondylolisthesis, lumbosacral region: Secondary | ICD-10-CM

## 2018-03-20 MED ORDER — MEPERIDINE HCL 100 MG/ML IJ SOLN
100.0000 mg | Freq: Once | INTRAMUSCULAR | Status: AC
  Administered 2018-03-20: 100 mg via INTRAMUSCULAR

## 2018-03-20 MED ORDER — IOPAMIDOL (ISOVUE-M 200) INJECTION 41%
15.0000 mL | Freq: Once | INTRAMUSCULAR | Status: AC
  Administered 2018-03-20: 15 mL via INTRATHECAL

## 2018-03-20 MED ORDER — DIAZEPAM 5 MG PO TABS
10.0000 mg | ORAL_TABLET | Freq: Once | ORAL | Status: AC
  Administered 2018-03-20: 10 mg via ORAL

## 2018-03-20 MED ORDER — ONDANSETRON HCL 4 MG/2ML IJ SOLN: 4.0000 mg | Freq: Four times a day (QID) | INTRAMUSCULAR | Status: DC | PRN

## 2018-03-20 MED ORDER — ONDANSETRON HCL 4 MG/2ML IJ SOLN
4.0000 mg | Freq: Once | INTRAMUSCULAR | Status: AC
  Administered 2018-03-20: 4 mg via INTRAMUSCULAR

## 2018-03-20 MED ORDER — HYDROMORPHONE HCL 2 MG PO TABS
2.0000 mg | ORAL_TABLET | Freq: Once | ORAL | Status: AC
  Administered 2018-03-20: 2 mg via ORAL

## 2018-03-20 NOTE — Progress Notes (Signed)
Patient states he has been off Buspar and Zoloft for at least the past two days.

## 2018-03-20 NOTE — Discharge Instructions (Signed)
Myelogram Discharge Instructions  1. Go home and rest quietly for the next 24 hours.  It is important to lie flat for the next 24 hours.  Get up only to go to the restroom.  You may lie in the bed or on a couch on your back, your stomach, your left side or your right side.  You may have one pillow under your head.  You may have pillows between your knees while you are on your side or under your knees while you are on your back.  2. DO NOT drive today.  Recline the seat as far back as it will go, while still wearing your seat belt, on the way home.  3. You may get up to go to the bathroom as needed.  You may sit up for 10 minutes to eat.  You may resume your normal diet and medications unless otherwise indicated.  Drink plenty of extra fluids today and tomorrow.  4. The incidence of a spinal headache with nausea and/or vomiting is about 5% (one in 20 patients).  If you develop a headache, lie flat and drink plenty of fluids until the headache goes away.  Caffeinated beverages may be helpful.  If you develop severe nausea and vomiting or a headache that does not go away with flat bed rest, call [phone].  5. You may resume normal activities after your 24 hours of bed rest is over; however, do not exert yourself strongly or do any heavy lifting tomorrow.  6. Call your physician for a follow-up appointment.    You may resume Buspar and Zoloft on Saturday, Mar 21, 2018 after 9:30a.m.

## 2018-03-24 ENCOUNTER — Other Ambulatory Visit: Payer: Self-pay | Admitting: Neurological Surgery

## 2018-03-24 DIAGNOSIS — M4317 Spondylolisthesis, lumbosacral region: Secondary | ICD-10-CM

## 2018-03-31 ENCOUNTER — Other Ambulatory Visit: Payer: Self-pay | Admitting: Physician Assistant

## 2018-03-31 NOTE — Telephone Encounter (Signed)
Last OV 03/17/18, No future OV  Last filled 03/02/18, # 60 with 0 refills

## 2018-03-31 NOTE — Telephone Encounter (Signed)
Last OV 03/17/18, No future OV  Last filled 12/02/17, # 30 with 3 refills  Is the patient still taking this Rx? Currently on Lamictal and Buspar, also weaning Alprazolam  Please advise.

## 2018-04-13 ENCOUNTER — Other Ambulatory Visit: Payer: Self-pay | Admitting: Physician Assistant

## 2018-04-27 ENCOUNTER — Other Ambulatory Visit: Payer: Self-pay | Admitting: Physician Assistant

## 2018-04-27 NOTE — Telephone Encounter (Signed)
Last OV 03/17/18, No future OV  Last filled 03/31/18, # 60 with 0 refills

## 2018-05-06 ENCOUNTER — Telehealth: Payer: Self-pay | Admitting: Physician Assistant

## 2018-05-28 ENCOUNTER — Other Ambulatory Visit: Payer: Self-pay | Admitting: Emergency Medicine

## 2018-05-28 NOTE — Telephone Encounter (Signed)
Xanax last filled 04/28/18 #60 CSC: 04/25/17 UDS: 03/02/18  Please advise

## 2018-05-28 NOTE — Telephone Encounter (Signed)
[facility] [place].

## 2018-05-29 MED ORDER — BUSPIRONE HCL 7.5 MG PO TABS: 7.5000 mg | ORAL_TABLET | Freq: Two times a day (BID) | ORAL | 2 refills | Status: DC

## 2018-05-29 MED ORDER — ALPRAZOLAM 1 MG PO TABS: 1.0000 mg | ORAL_TABLET | Freq: Two times a day (BID) | ORAL | 0 refills | Status: DC | PRN

## 2018-05-29 NOTE — Telephone Encounter (Signed)
Refills sent.  Patient has follow-up scheduled next week.  Will update CSC at that time.

## 2018-06-01 ENCOUNTER — Ambulatory Visit: Payer: 59 | Admitting: Physician Assistant

## 2018-06-26 ENCOUNTER — Other Ambulatory Visit: Payer: Self-pay | Admitting: Physician Assistant

## 2018-06-26 NOTE — Telephone Encounter (Signed)
Last OV 03/17/18, No future OV, no show/canceled visit scheduled for 06/01/18  Last filled 05/29/18, # 60 with 0 refills

## 2018-06-30 ENCOUNTER — Encounter: Payer: Self-pay | Admitting: Physician Assistant

## 2018-06-30 ENCOUNTER — Encounter: Payer: Self-pay | Admitting: General Practice

## 2018-06-30 ENCOUNTER — Ambulatory Visit (INDEPENDENT_AMBULATORY_CARE_PROVIDER_SITE_OTHER): Payer: 59 | Admitting: Physician Assistant

## 2018-06-30 ENCOUNTER — Other Ambulatory Visit: Payer: Self-pay

## 2018-06-30 VITALS — BP 120/88 | HR 77 | Temp 98.7°F | Resp 16 | Ht 73.0 in | Wt 250.0 lb

## 2018-06-30 DIAGNOSIS — F411 Generalized anxiety disorder: Secondary | ICD-10-CM

## 2018-06-30 MED ORDER — ALPRAZOLAM 1 MG PO TABS: 1.0000 mg | ORAL_TABLET | Freq: Two times a day (BID) | ORAL | 0 refills | Status: DC | PRN

## 2018-06-30 MED ORDER — BUSPIRONE HCL 10 MG PO TABS: 10.0000 mg | ORAL_TABLET | Freq: Two times a day (BID) | ORAL | 2 refills | Status: DC

## 2018-06-30 NOTE — Assessment & Plan Note (Signed)
Increase BuSpar to 10 mg BID. Continue other medications as directed. CSC updated today. Follow-up 2 months.

## 2018-06-30 NOTE — Patient Instructions (Signed)
Please keep well-hydrated and get plenty of rest.  Continue medications as directed, taking the new dose of the BuSpar twice daily. Follow-up with me in 2 months. Return sooner if not improving.

## 2018-06-30 NOTE — Progress Notes (Signed)
Patient presents to clinic today for follow-up of anxiety and depression. Patient is currently on a regimen of Sertraline 25 mg daily, Lamictal 100 mg QHS and Alprazolam PRN. Endorses taking as directed. At last visit patient was started on BuSpar 7.5 mg BID. Has been taking as directed with noted improvement in anxiety and decrease in use of Xanax. States this lasted for about a month or so then he felt it was not helping quite as much. Denies SI/HI or panic attack.  Past Medical History:  Diagnosis Date  . Anxiety   . Depression   . GERD (gastroesophageal reflux disease)     Current Outpatient Medications on File Prior to Visit  Medication Sig Dispense Refill  . EPINEPHrine 0.3 mg/0.3 mL IJ SOAJ injection Inject 0.3 mg into the muscle.   0  . gabapentin (NEURONTIN) 300 MG capsule Take 3 capsules at bedtime  0  . lamoTRIgine (LAMICTAL) 100 MG tablet TAKE ONE TABLET BY MOUTH AT BEDTIME 30 tablet 5  . oxyCODONE-acetaminophen (PERCOCET) 7.5-325 MG tablet Take 1 tablet by mouth every 6 (six) hours as needed.   0  . RABEprazole (ACIPHEX) 20 MG tablet Take 1 tablet (20 mg total) by mouth daily. 30 tablet 3  . sertraline (ZOLOFT) 25 MG tablet TAKE ONE TABLET BY MOUTH EVERY DAY 30 tablet 3  . sildenafil (REVATIO) 20 MG tablet Take 1-2 tablets before sexual intercourse. No more than one dose in 24 hours. 10 tablet 0   No current facility-administered medications on file prior to visit.     Allergies  Allergen Reactions  . Bee Venom Other (See Comments)    Localized swelling  . Hydrocodone-Acetaminophen Nausea Only    Family History  Problem Relation Age of Onset  . Diabetes Father   . Heart disease Father   . Hypertension Father     Social History   Socioeconomic History  . Marital status: Married    Spouse name: Not on file  . Number of children: Not on file  . Years of education: Not on file  . Highest education level: Not on file  Occupational History  . Not on file    Social Needs  . Financial resource strain: Not on file  . Food insecurity:    Worry: Not on file    Inability: Not on file  . Transportation needs:    Medical: Not on file    Non-medical: Not on file  Tobacco Use  . Smoking status: Current Every Day Smoker    Packs/day: 0.50    Types: Cigarettes  . Smokeless tobacco: Never Used  Substance and Sexual Activity  . Alcohol use: No    Alcohol/week: 0.0 standard drinks  . Drug use: No  . Sexual activity: Yes  Lifestyle  . Physical activity:    Days per week: Not on file    Minutes per session: Not on file  . Stress: Not on file  Relationships  . Social connections:    Talks on phone: Not on file    Gets together: Not on file    Attends religious service: Not on file    Active member of club or organization: Not on file    Attends meetings of clubs or organizations: Not on file    Relationship status: Not on file  Other Topics Concern  . Not on file  Social History Narrative  . Not on file   Review of Systems - See HPI.  All other ROS are negative.  BP 120/88   Pulse 77   Temp 98.7 F (37.1 C) (Oral)   Resp 16   Ht 6\' 1"  (1.854 m)   Wt 250 lb (113.4 kg)   SpO2 98%   BMI 32.98 kg/m   Physical Exam  Constitutional: He is oriented to person, place, and time. He appears well-developed and well-nourished.  HENT:  Head: Normocephalic and atraumatic.  Eyes: Conjunctivae are normal.  Neck: Neck supple.  Cardiovascular: Normal rate, regular rhythm, normal heart sounds and intact distal pulses.  Pulmonary/Chest: Effort normal.  Neurological: He is alert and oriented to person, place, and time.  Psychiatric: He has a normal mood and affect.  Vitals reviewed.  Assessment/Plan: Generalized anxiety disorder Increase BuSpar to 10 mg BID. Continue other medications as directed. CSC updated today. Follow-up 2 months.    Piedad ClimesWilliam Cody Dion Parrow, PA-C

## 2018-07-13 ENCOUNTER — Other Ambulatory Visit: Payer: Self-pay | Admitting: Physician Assistant

## 2018-07-26 ENCOUNTER — Other Ambulatory Visit: Payer: Self-pay | Admitting: Physician Assistant

## 2018-07-29 ENCOUNTER — Other Ambulatory Visit: Payer: Self-pay | Admitting: Physician Assistant

## 2018-07-29 NOTE — Telephone Encounter (Signed)
Xanax last filled 06/30/18 # 60 CSC: 06/30/18 UDS: 03/02/18  Please advise

## 2018-07-29 NOTE — Telephone Encounter (Signed)
Last OV: 06/30/2018   Last Fill: 06/30/2018

## 2018-08-26 ENCOUNTER — Other Ambulatory Visit: Payer: Self-pay | Admitting: Physician Assistant

## 2018-08-28 ENCOUNTER — Other Ambulatory Visit: Payer: Self-pay | Admitting: Physician Assistant

## 2018-08-31 ENCOUNTER — Other Ambulatory Visit: Payer: Self-pay | Admitting: Physician Assistant

## 2018-08-31 NOTE — Telephone Encounter (Signed)
Copied from CRM [id number]. Topic: General - Other >> Aug 31, 2018 11:18 AM Ronney Lion A wrote: Medication: ALPRAZolam Prudy Feeler) 1 MG tablet   Has the patient contacted their pharmacy? Yes, pt's wife says the pharmacy has been sending requests to have medication refilled, but haven't gotten an answer. Pt is currently out of medication   Preferred Pharmacy (with phone number or street name): [id number] [place], [place] - [address]  [phone] (Phone) [phone] (Fax)   Agent: Please be advised that RX refills may take up to 3 business days. We ask that you follow-up with your pharmacy.

## 2018-08-31 NOTE — Telephone Encounter (Signed)
Medication: ALPRAZolam (XANAX) 1 MG tablet    Pt is currently out of medication    [id number] - [place], [place] - [address]            [phone] (Phone) [phone] (Fax)

## 2018-08-31 NOTE — Telephone Encounter (Signed)
Last OV 06/30/18 Alprazolam last filled 07/30/18 #60 with 0

## 2018-09-10 IMAGING — CT CT RENAL STONE PROTOCOL
2 of 3 series · 16 of 46 positions shown, 18 images · non-contrast
Comparison: None.

CLINICAL DATA: Right flank pain radiating to the right lower
quadrant

EXAM:
CT ABDOMEN AND PELVIS WITHOUT CONTRAST
TECHNIQUE: Multidetector CT imaging of the abdomen and pelvis was performed
following the standard protocol without IV contrast.

[Series 3: coronal · coronal · 0.74mm/px · 3 of 146 slices shown]
[im 49/146  soft-tissue]
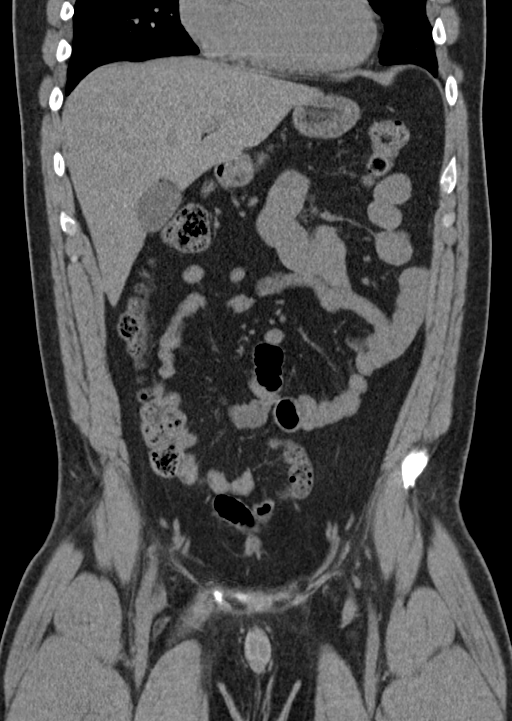
[im 65/146  soft-tissue]
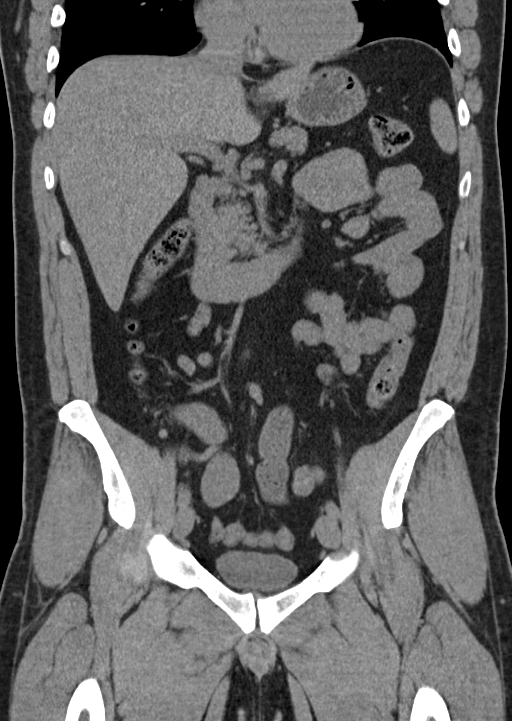
[im 81/146  soft-tissue]
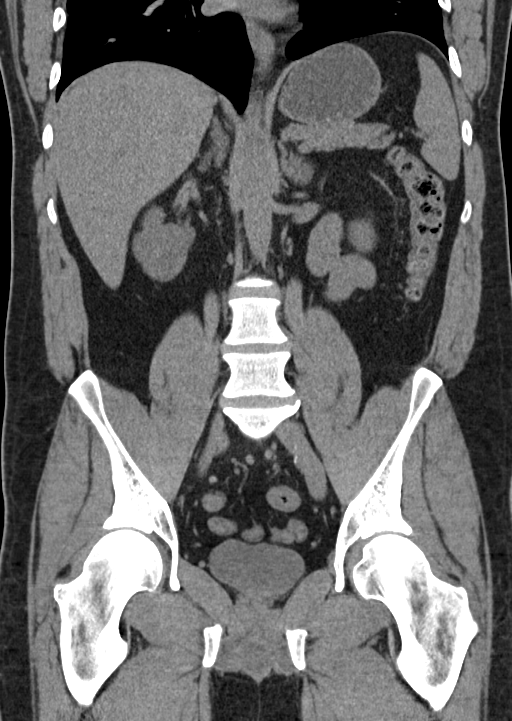

[Series 6: lung · axial · 0.76mm/px · z∈[+1269,+1377]mm · 13 of 63 slices shown, 15 images]
[im 5/63  soft-tissue]
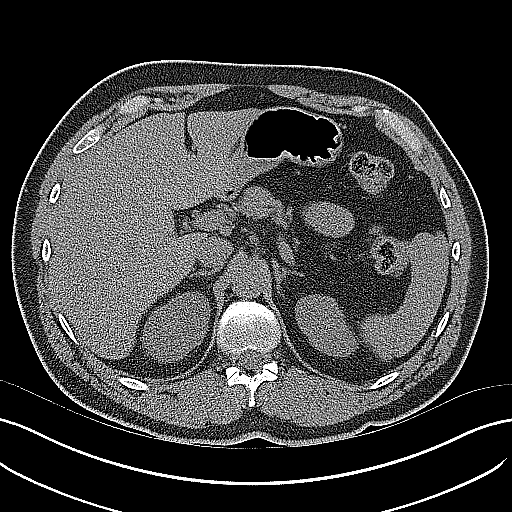
[im 5/63  bone]
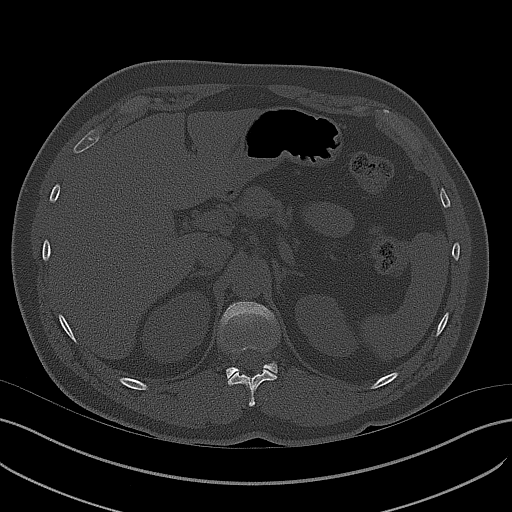
[im 9/63  soft-tissue]
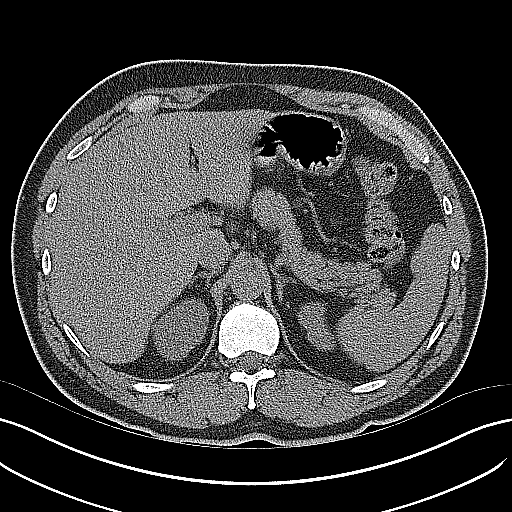
[im 13/63  soft-tissue]
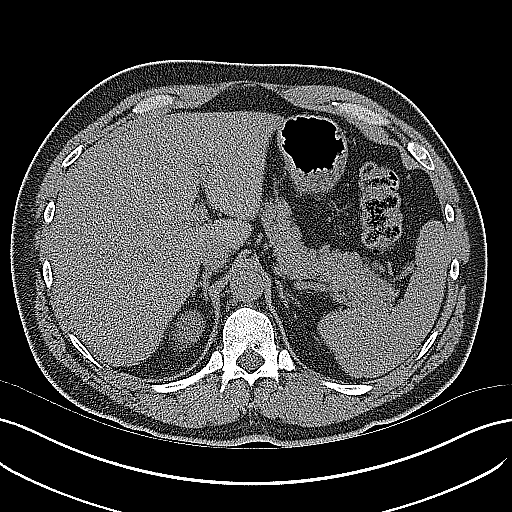
[im 19/63  soft-tissue]
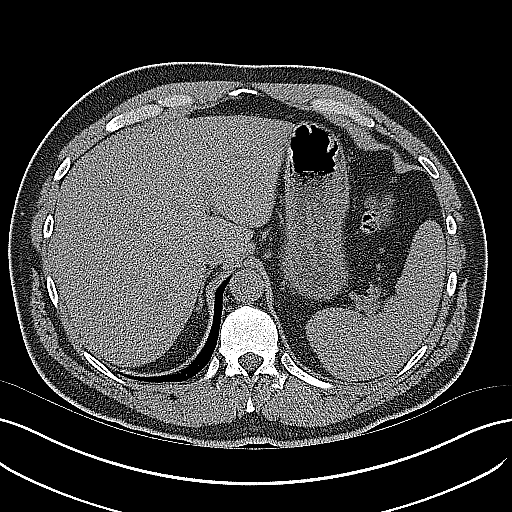
[im 23/63  soft-tissue]
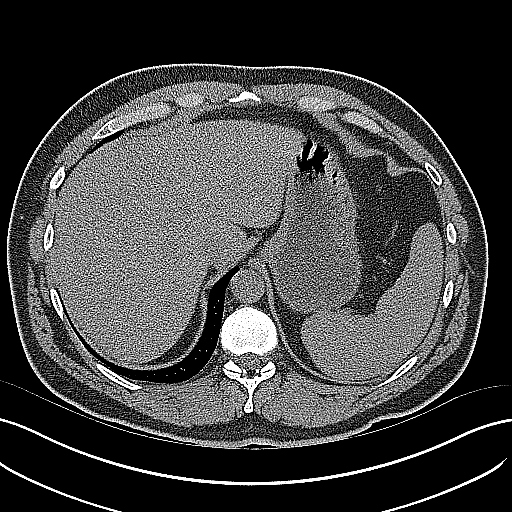
[im 27/63  soft-tissue]
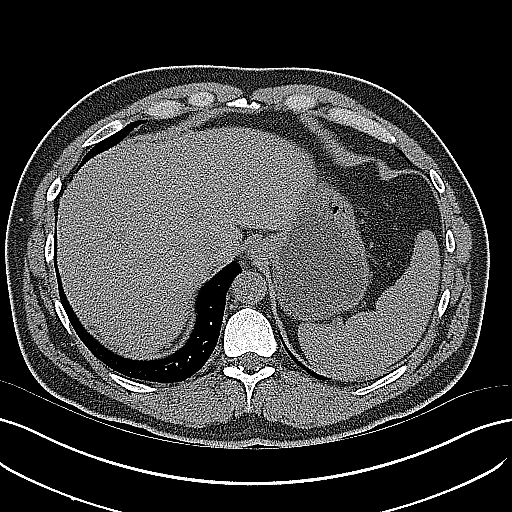
[im 33/63  soft-tissue]
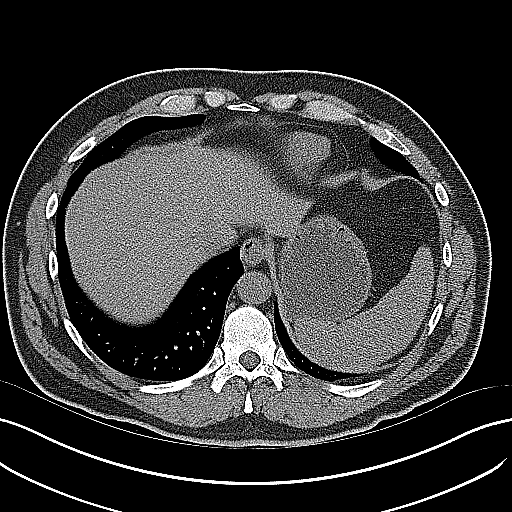
[im 37/63  soft-tissue]
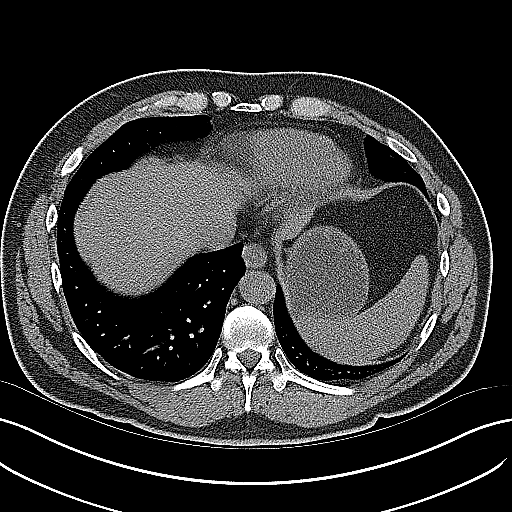
[im 41/63  soft-tissue]
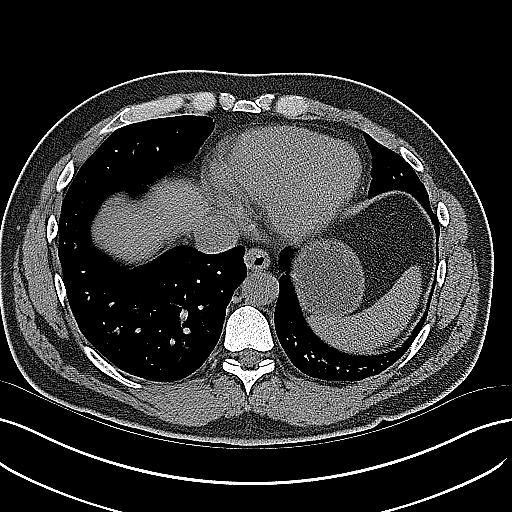
[im 41/63  bone]
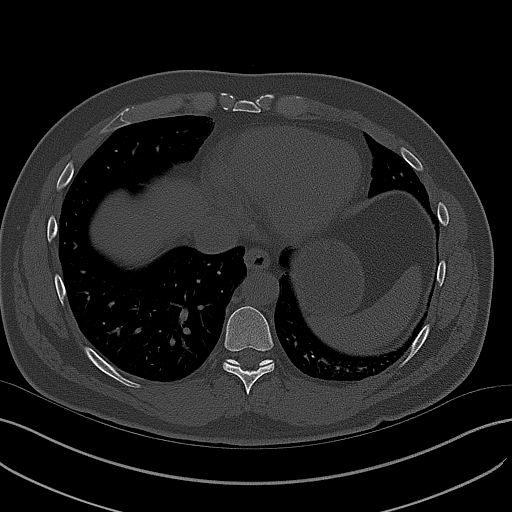
[im 45/63  soft-tissue]
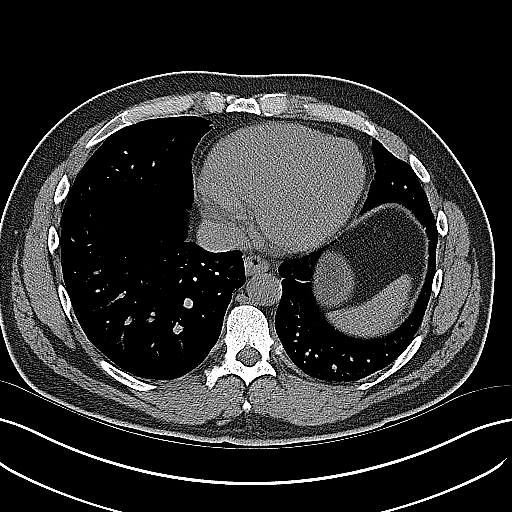
[im 51/63  soft-tissue]
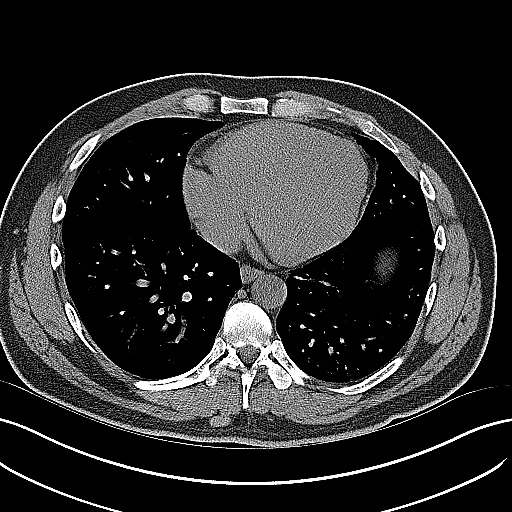
[im 55/63  soft-tissue]
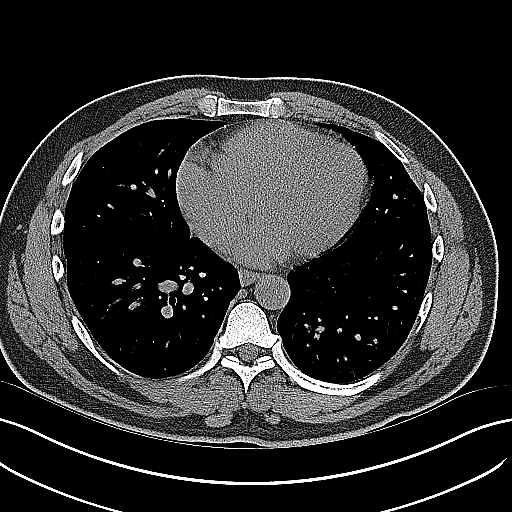
[im 59/63  soft-tissue]
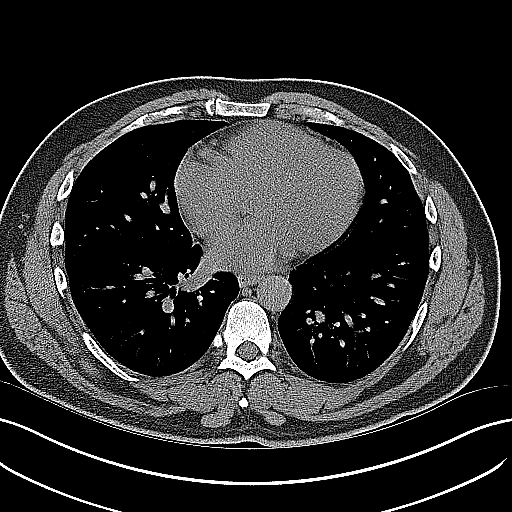

[16 of 46 positions shown; findings below may reference images not displayed]

FINDINGS: Lower chest: The lung bases are clear although somewhat obscured by
motion.

Hepatobiliary: The liver is unremarkable in the unenhanced state. No
calcified gallstones are seen.

Pancreas: The pancreas is normal in size and the pancreatic duct is
not dilated.

Spleen: The spleen is unremarkable.

Adrenals/Urinary Tract: The adrenal glands appear symmetrical and
normal. No renal calculi are seen. There is perhaps minimal fullness
of the right pelvocaliceal system due to low-grade obstruction
caused by a 4 mm proximal right ureteral calculus. The left ureter
is normal in caliber. The urinary bladder is moderately well
distended with no abnormality noted.

Stomach/Bowel: The stomach is moderately distended with fluid with
no abnormality evident. No small bowel distention is seen. The colon
is largely decompressed. The terminal ileum and the appendix are
unremarkable.

Vascular/Lymphatic: The abdominal aorta is normal in caliber. No
adenopathy is seen.

Reproductive: The prostate gland is normal in size. No fluid is seen
within the pelvis.

Other: None

Musculoskeletal: The lumbar vertebrae are in normal alignment. There
are pars defects bilaterally at L5 but normal alignment is
maintained. Intervertebral disc spaces appear normal.
IMPRESSION: 1. Low-grade right hydronephrosis caused by a 4 mm proximal right
ureteral calculus. No additional renal or ureteral calculi are seen.
2. The terminal ileum and the appendix are unremarkable.
3. Bilateral pars defects at L5.  Normal alignment.

## 2018-09-11 ENCOUNTER — Ambulatory Visit: Payer: 59 | Admitting: Physician Assistant

## 2018-09-11 DIAGNOSIS — Z0289 Encounter for other administrative examinations: Secondary | ICD-10-CM

## 2018-09-15 ENCOUNTER — Other Ambulatory Visit: Payer: Self-pay | Admitting: Physician Assistant

## 2023-08-18 ENCOUNTER — Ambulatory Visit: Payer: Self-pay | Admitting: Internal Medicine

## 2024-02-11 ENCOUNTER — Ambulatory Visit: Payer: BC Managed Care – PPO | Admitting: Internal Medicine

## 2024-03-02 ENCOUNTER — Telehealth: Payer: Self-pay

## 2024-03-02 NOTE — Telephone Encounter (Signed)
 Copied from CRM [id number]. Topic: Appointments - Appointment Scheduling >> Mar 02, 2024  8:02 AM Allyne Areola wrote: Patient/patient representative is calling to schedule an appointment. Refer to attachments for appointment information. Patient's spouse Tico Crotteau is calling to try and set up the patient with a new patient appointment with Dr.Morrison. He was taken to the ER yesterday for dizziness and chest pain. They released patient and advise to follow up with his primary care; however, patient does not have a doctor at the moment. Best call back number is [phone].  Please see CMR message set by [email or web address] and contact patient to schedule NP appt with Dr Boston Byers.

## 2024-03-29 ENCOUNTER — Ambulatory Visit: Admitting: Internal Medicine

## 2024-04-02 ENCOUNTER — Ambulatory Visit: Admitting: Internal Medicine

## 2024-04-12 ENCOUNTER — Ambulatory Visit: Payer: Self-pay

## 2024-04-12 NOTE — Telephone Encounter (Signed)
 Copied from CRM [id number]. Topic: Clinical - Red Word Triage >> Apr 12, 2024 11:47 AM Dorisann Garre T wrote: Red Word that prompted transfer to Nurse Triage: patient is having a heart issues he went to er and he has been feeling dizzy and not well a all he has not been able to go to work today it is getting worse he needs to be seen sooner   Chief Complaint: Chest pain Symptoms: Chest pain, dizziness  Disposition: [x] ED /[] Urgent Care (no appt availability in office) / [] Appointment(In office/virtual)/ []  [facility]/ [] Home Care/ [] Refused Recommended Disposition /[] [facility]/ []  Follow-up with PCP Additional Notes: Patients wife called to report that the patient has been experiencing chest pain and dizziness that began yesterday. She states he has a new patient appointment next month but wanted to see if there was anything available sooner. I advised there are no earlier appointments at this time and have placed him on the wait list. I advised that with his symptoms the patient should go to the ED for evaluation and treatment. She verbalized understanding and agreement with this plan.     Reason for Disposition  Dizziness or lightheadedness  Answer Assessment - Initial Assessment Questions 1. LOCATION: "Where does it hurt?"       Chest  2. RADIATION: "Does the pain go anywhere else?" (e.g., into neck, jaw, arms, back)     Unsure 3. ONSET: "When did the chest pain begin?" (Minutes, hours or days)      Yesterday  4. PATTERN: "Does the pain come and go, or has it been constant since it started?"  "Does it get worse with exertion?"      Unsure  5. DURATION: "How long does it last" (e.g., seconds, minutes, hours)     Unsure  6. SEVERITY: "How bad is the pain?"  (e.g., Scale 1-10; mild, moderate, or severe)    - MILD (1-3): doesn't interfere with normal activities     - MODERATE (4-7): interferes with normal activities or awakens from sleep    - SEVERE (8-10): excruciating  pain, unable to do any normal activities       Moderate  7. CARDIAC RISK FACTORS: "Do you have any history of heart problems or risk factors for heart disease?" (e.g., angina, prior heart attack; diabetes, high blood pressure, high cholesterol, smoker, or strong family history of heart disease)     No 8. PULMONARY RISK FACTORS: "Do you have any history of lung disease?"  (e.g., blood clots in lung, asthma, emphysema, birth control pills)     No 9. CAUSE: "What do you think is causing the chest pain?"     Unsure  10. OTHER SYMPTOMS: "Do you have any other symptoms?" (e.g., dizziness, nausea, vomiting, sweating, fever, difficulty breathing, cough)       Dizziness  Protocols used: Chest Pain-A-AH

## 2024-05-18 ENCOUNTER — Ambulatory Visit: Admitting: Internal Medicine
